# Patient Record
Sex: Male | Born: 1980 | Race: Black or African American | Hispanic: No | Marital: Single | Smoking: Former smoker
Health system: Southern US, Community
[De-identification: ages and names within clinical notes are randomized; demographics above are authoritative.]

## PROBLEM LIST (undated history)

## (undated) DIAGNOSIS — I509 Heart failure, unspecified: Secondary | ICD-10-CM

## (undated) DIAGNOSIS — I10 Essential (primary) hypertension: Secondary | ICD-10-CM

## (undated) DIAGNOSIS — E1159 Type 2 diabetes mellitus with other circulatory complications: Secondary | ICD-10-CM

## (undated) DIAGNOSIS — J9621 Acute and chronic respiratory failure with hypoxia: Secondary | ICD-10-CM

## (undated) DIAGNOSIS — F17200 Nicotine dependence, unspecified, uncomplicated: Secondary | ICD-10-CM

## (undated) DIAGNOSIS — E119 Type 2 diabetes mellitus without complications: Secondary | ICD-10-CM

## (undated) DIAGNOSIS — J9690 Respiratory failure, unspecified, unspecified whether with hypoxia or hypercapnia: Secondary | ICD-10-CM

---
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
## 2010-01-22 ENCOUNTER — Emergency Department (HOSPITAL_BASED_OUTPATIENT_CLINIC_OR_DEPARTMENT_OTHER): Admission: EM | Admit: 2010-01-22 | Discharge: 2010-01-22 | Payer: Self-pay | Admitting: Emergency Medicine

## 2010-12-10 ENCOUNTER — Encounter: Payer: Self-pay | Admitting: *Deleted

## 2010-12-10 ENCOUNTER — Emergency Department (HOSPITAL_BASED_OUTPATIENT_CLINIC_OR_DEPARTMENT_OTHER)
Admission: EM | Admit: 2010-12-10 | Discharge: 2010-12-10 | Disposition: A | Discharge status: Home / Self Care | Payer: Self-pay | Attending: Emergency Medicine | Admitting: Emergency Medicine

## 2010-12-10 DIAGNOSIS — M549 Dorsalgia, unspecified: Secondary | ICD-10-CM | POA: Insufficient documentation

## 2010-12-10 DIAGNOSIS — K0889 Other specified disorders of teeth and supporting structures: Secondary | ICD-10-CM

## 2010-12-10 DIAGNOSIS — K089 Disorder of teeth and supporting structures, unspecified: Secondary | ICD-10-CM | POA: Insufficient documentation

## 2010-12-10 MED ORDER — PENICILLIN V POTASSIUM 500 MG PO TABS: 500.0000 mg | ORAL_TABLET | Freq: Four times a day (QID) | ORAL | Status: AC

## 2010-12-10 MED ORDER — NAPROXEN 500 MG PO TABS: 500.0000 mg | ORAL_TABLET | Freq: Two times a day (BID) | ORAL | Status: AC

## 2010-12-10 MED ORDER — HYDROCODONE-ACETAMINOPHEN 5-325 MG PO TABS: 1.0000 | ORAL_TABLET | ORAL | Status: AC | PRN

## 2010-12-10 NOTE — ED Notes (Signed)
Pt c/o toothache months, and lower back pain x 1 day ago

## 2011-03-10 NOTE — ED Provider Notes (Signed)
History     CSN: [id number] Arrival date & time: 12/10/2010  9:07 PM   None     Chief Complaint  Patient presents with  . Dental Pain  . Back Pain    (Consider location/radiation/quality/duration/timing/severity/associated sxs/prior treatment) Patient is a 30 y.o. male presenting with tooth pain and back pain. The history is provided by the patient.  Dental PainThe primary symptoms include mouth pain. Primary symptoms do not include dental injury, oral bleeding, headaches, fever, shortness of breath, sore throat, angioedema or cough. Primary symptoms comment: TOOTHACHE The symptoms began more than 1 week ago (ONE MONTH AGO BUT WORSE YESTERDAY AND TODAY). The symptoms are worsening. The symptoms occur constantly.  Additional symptoms include: gum swelling, gum tenderness and jaw pain. Additional symptoms do not include: purulent gums, trismus, facial swelling, trouble swallowing, pain with swallowing, excessive salivation, drooling, ear pain and fatigue. Medical issues include: smoking.   Back Pain  Pertinent negatives include no chest pain, no fever, no numbness, no headaches, no abdominal pain, no dysuria and no weakness.  BACK PAIN BILAT LOWER BACK ONSET YESTERDAY, NO SPECIFIC INJURY EX WAS LIFTING HEAVY OBJECTS.   TOOTH INVOLVED IS RIGHT LOWER LAST MOLAR.   History reviewed. No pertinent past medical history.  History reviewed. No pertinent past surgical history.  History reviewed. No pertinent family history.  History  Substance Use Topics  . Smoking status: Current Everyday Smoker -- 0.5 packs/day  . Smokeless tobacco: Not on file  . Alcohol Use: No      Review of Systems  Constitutional: Negative for fever, chills and fatigue.  HENT: Positive for dental problem. Negative for ear pain, sore throat, facial swelling, drooling, trouble swallowing and neck pain.   Eyes: Negative for photophobia, redness and visual disturbance.  Respiratory: Negative for cough and shortness  of breath.   Cardiovascular: Negative for chest pain.  Gastrointestinal: Negative for nausea, vomiting and abdominal pain.  Genitourinary: Negative for dysuria and hematuria.  Musculoskeletal: Positive for back pain.  Skin: Negative for rash.  Neurological: Negative for dizziness, weakness, numbness and headaches.  Hematological: Negative for adenopathy.    Allergies  Review of patient's allergies indicates no known allergies.  Home Medications   Current Outpatient Rx  Name Route Sig Dispense Refill  . NAPROXEN 500 MG PO TABS Oral Take 1 tablet (500 mg total) by mouth 2 (two) times daily. 14 tablet 0    BP 124/83  Pulse 84  Temp 98.5 F (36.9 C)  Resp 16  Wt 200 lb (90.719 kg)  Physical Exam  Nursing note and vitals reviewed. Constitutional: He appears well-developed and well-nourished.  HENT:  Head: Normocephalic and atraumatic.  Mouth/Throat: Oropharynx is clear and moist.       EX FOR RIGHT LOWER LAST MOLAR WITH DECAY AND TENDERNESS NO PURULENT DISCHARGE.   Eyes: Conjunctivae and EOM are normal. Pupils are equal, round, and reactive to light.  Neck: Normal range of motion. Neck supple.  Cardiovascular: Normal rate, regular rhythm and normal heart sounds.   No murmur heard. Pulmonary/Chest: Effort normal and breath sounds normal.  Abdominal: Soft. Bowel sounds are normal. There is no tenderness.  Musculoskeletal: Normal range of motion. He exhibits tenderness. He exhibits no edema.       BILAT LOWER LUMBAR BACK PARASPINOUS TENDERNESS NO MIDLINE SPINOUS TENDERNESS.   Neurological: He is alert. No cranial nerve deficit. He exhibits normal muscle tone. Coordination normal.  Skin: No rash noted. No erythema.    ED Course  Procedures (including  critical care time)  Labs Reviewed - No data to display No results found.   1. Toothache   2. Back pain       MDM  NO LABS OR XRAYS.   BOTH TOOTHACHE AND LOW BACK PAIN. WILL TREAT BOTH WITH NAPROSYN AND HYDROCODONE  AND PENICILLIN FOR TOOTHACHE. WILL NEED DENTAL FOLLOW UP.         Shelda Jakes, MD 03/10/11 [phone]

## 2015-08-21 ENCOUNTER — Other Ambulatory Visit (HOSPITAL_COMMUNITY): Payer: Self-pay | Admitting: Dentistry

## 2015-08-21 DIAGNOSIS — G4730 Sleep apnea, unspecified: Secondary | ICD-10-CM

## 2015-08-21 DIAGNOSIS — E0500 Thyrotoxicosis with diffuse goiter without thyrotoxic crisis or storm: Secondary | ICD-10-CM

## 2015-08-30 ENCOUNTER — Ambulatory Visit (HOSPITAL_COMMUNITY): Payer: Disability Insurance | Attending: Cardiovascular Disease

## 2015-08-30 ENCOUNTER — Other Ambulatory Visit: Payer: Self-pay

## 2015-08-30 DIAGNOSIS — G4730 Sleep apnea, unspecified: Secondary | ICD-10-CM | POA: Diagnosis not present

## 2015-08-30 DIAGNOSIS — E0500 Thyrotoxicosis with diffuse goiter without thyrotoxic crisis or storm: Secondary | ICD-10-CM | POA: Diagnosis present

## 2015-08-30 DIAGNOSIS — Z6841 Body Mass Index (BMI) 40.0 and over, adult: Secondary | ICD-10-CM | POA: Diagnosis not present

## 2015-08-30 DIAGNOSIS — G4733 Obstructive sleep apnea (adult) (pediatric): Secondary | ICD-10-CM | POA: Insufficient documentation

## 2018-10-23 ENCOUNTER — Emergency Department (HOSPITAL_BASED_OUTPATIENT_CLINIC_OR_DEPARTMENT_OTHER): Payer: Self-pay

## 2018-10-23 ENCOUNTER — Encounter (HOSPITAL_BASED_OUTPATIENT_CLINIC_OR_DEPARTMENT_OTHER): Payer: Self-pay | Admitting: Emergency Medicine

## 2018-10-23 ENCOUNTER — Other Ambulatory Visit: Payer: Self-pay

## 2018-10-23 ENCOUNTER — Inpatient Hospital Stay (HOSPITAL_BASED_OUTPATIENT_CLINIC_OR_DEPARTMENT_OTHER)
Admission: EM | Admit: 2018-10-23 | Discharge: 2018-10-28 | DRG: 208 | Disposition: A | Discharge status: Home / Self Care | Payer: Self-pay | Attending: Internal Medicine | Admitting: Internal Medicine

## 2018-10-23 DIAGNOSIS — J9622 Acute and chronic respiratory failure with hypercapnia: Secondary | ICD-10-CM

## 2018-10-23 DIAGNOSIS — G92 Toxic encephalopathy: Secondary | ICD-10-CM | POA: Diagnosis present

## 2018-10-23 DIAGNOSIS — F17210 Nicotine dependence, cigarettes, uncomplicated: Secondary | ICD-10-CM | POA: Diagnosis present

## 2018-10-23 DIAGNOSIS — N183 Chronic kidney disease, stage 3 unspecified: Secondary | ICD-10-CM | POA: Diagnosis present

## 2018-10-23 DIAGNOSIS — J9601 Acute respiratory failure with hypoxia: Secondary | ICD-10-CM

## 2018-10-23 DIAGNOSIS — Z20828 Contact with and (suspected) exposure to other viral communicable diseases: Secondary | ICD-10-CM | POA: Diagnosis present

## 2018-10-23 DIAGNOSIS — J9621 Acute and chronic respiratory failure with hypoxia: Principal | ICD-10-CM | POA: Diagnosis present

## 2018-10-23 DIAGNOSIS — Z79899 Other long term (current) drug therapy: Secondary | ICD-10-CM

## 2018-10-23 DIAGNOSIS — E872 Acidosis: Secondary | ICD-10-CM | POA: Diagnosis present

## 2018-10-23 DIAGNOSIS — J9690 Respiratory failure, unspecified, unspecified whether with hypoxia or hypercapnia: Secondary | ICD-10-CM

## 2018-10-23 DIAGNOSIS — I152 Hypertension secondary to endocrine disorders: Secondary | ICD-10-CM

## 2018-10-23 DIAGNOSIS — E1159 Type 2 diabetes mellitus with other circulatory complications: Secondary | ICD-10-CM | POA: Diagnosis present

## 2018-10-23 DIAGNOSIS — Z9119 Patient's noncompliance with other medical treatment and regimen: Secondary | ICD-10-CM

## 2018-10-23 DIAGNOSIS — I452 Bifascicular block: Secondary | ICD-10-CM | POA: Diagnosis present

## 2018-10-23 DIAGNOSIS — I130 Hypertensive heart and chronic kidney disease with heart failure and stage 1 through stage 4 chronic kidney disease, or unspecified chronic kidney disease: Secondary | ICD-10-CM | POA: Diagnosis present

## 2018-10-23 DIAGNOSIS — J449 Chronic obstructive pulmonary disease, unspecified: Secondary | ICD-10-CM | POA: Diagnosis present

## 2018-10-23 DIAGNOSIS — Z6841 Body Mass Index (BMI) 40.0 and over, adult: Secondary | ICD-10-CM

## 2018-10-23 DIAGNOSIS — R7303 Prediabetes: Secondary | ICD-10-CM | POA: Diagnosis present

## 2018-10-23 DIAGNOSIS — I272 Pulmonary hypertension, unspecified: Secondary | ICD-10-CM | POA: Diagnosis present

## 2018-10-23 DIAGNOSIS — Z79890 Hormone replacement therapy (postmenopausal): Secondary | ICD-10-CM

## 2018-10-23 DIAGNOSIS — E039 Hypothyroidism, unspecified: Secondary | ICD-10-CM | POA: Diagnosis present

## 2018-10-23 DIAGNOSIS — M79672 Pain in left foot: Secondary | ICD-10-CM | POA: Diagnosis present

## 2018-10-23 DIAGNOSIS — N179 Acute kidney failure, unspecified: Secondary | ICD-10-CM | POA: Diagnosis present

## 2018-10-23 DIAGNOSIS — I5033 Acute on chronic diastolic (congestive) heart failure: Secondary | ICD-10-CM | POA: Diagnosis present

## 2018-10-23 DIAGNOSIS — E662 Morbid (severe) obesity with alveolar hypoventilation: Secondary | ICD-10-CM | POA: Diagnosis present

## 2018-10-23 HISTORY — DX: Type 2 diabetes mellitus without complications: E11.9

## 2018-10-23 HISTORY — DX: Morbid (severe) obesity due to excess calories: E66.01

## 2018-10-23 HISTORY — DX: Heart failure, unspecified: I50.9

## 2018-10-23 HISTORY — DX: Nicotine dependence, unspecified, uncomplicated: F17.200

## 2018-10-23 HISTORY — DX: Chronic kidney disease, stage 3 unspecified: N18.30

## 2018-10-23 HISTORY — DX: Respiratory failure, unspecified, unspecified whether with hypoxia or hypercapnia: J96.90

## 2018-10-23 HISTORY — DX: Essential (primary) hypertension: I10

## 2018-10-23 HISTORY — DX: Hypertension secondary to endocrine disorders: I15.2

## 2018-10-23 HISTORY — DX: Acute and chronic respiratory failure with hypoxia: J96.21

## 2018-10-23 HISTORY — DX: Type 2 diabetes mellitus with other circulatory complications: E11.59

## 2018-10-23 HISTORY — DX: Acute and chronic respiratory failure with hypercapnia: J96.22

## 2018-10-23 LAB — URINALYSIS, MICROSCOPIC (REFLEX)
Squamous Epithelial / HPF: NONE SEEN (ref 0–5)
WBC, UA: NONE SEEN WBC/hpf (ref 0–5)

## 2018-10-23 LAB — CBC WITH DIFFERENTIAL/PLATELET
Abs Immature Granulocytes: 0.07 10*3/uL (ref 0.00–0.07)
Basophils Absolute: 0.1 10*3/uL (ref 0.0–0.1)
Basophils Relative: 1 %
Eosinophils Absolute: 0.3 10*3/uL (ref 0.0–0.5)
Eosinophils Relative: 5 %
HCT: 42.4 % (ref 39.0–52.0)
Hemoglobin: 12.9 g/dL — ABNORMAL LOW (ref 13.0–17.0)
Immature Granulocytes: 1 %
Lymphocytes Relative: 20 %
Lymphs Abs: 1.4 10*3/uL (ref 0.7–4.0)
MCH: 28.7 pg (ref 26.0–34.0)
MCHC: 30.4 g/dL (ref 30.0–36.0)
MCV: 94.2 fL (ref 80.0–100.0)
Monocytes Absolute: 0.4 10*3/uL (ref 0.1–1.0)
Monocytes Relative: 7 %
Neutro Abs: 4.5 10*3/uL (ref 1.7–7.7)
Neutrophils Relative %: 66 %
Platelets: 163 10*3/uL (ref 150–400)
RBC: 4.5 MIL/uL (ref 4.22–5.81)
RDW: 15.7 % — ABNORMAL HIGH (ref 11.5–15.5)
WBC: 6.8 10*3/uL (ref 4.0–10.5)
nRBC: 0 % (ref 0.0–0.2)

## 2018-10-23 LAB — POCT I-STAT 7, (LYTES, BLD GAS, ICA,H+H)
Acid-Base Excess: 15 mmol/L — ABNORMAL HIGH (ref 0.0–2.0)
Acid-Base Excess: 13 mmol/L — ABNORMAL HIGH (ref 0.0–2.0)
Bicarbonate: 39.3 mmol/L — ABNORMAL HIGH (ref 20.0–28.0)
Bicarbonate: 35.9 mmol/L — ABNORMAL HIGH (ref 20.0–28.0)
Calcium, Ion: 1.14 mmol/L — ABNORMAL LOW (ref 1.15–1.40)
Calcium, Ion: 1.14 mmol/L — ABNORMAL LOW (ref 1.15–1.40)
HCT: 44 % (ref 39.0–52.0)
HCT: 42 % (ref 39.0–52.0)
Hemoglobin: 15 g/dL (ref 13.0–17.0)
Hemoglobin: 14.3 g/dL (ref 13.0–17.0)
O2 Saturation: 100 %
O2 Saturation: 93 %
Patient temperature: 98.6
Patient temperature: 36.6
Potassium: 3.2 mmol/L — ABNORMAL LOW (ref 3.5–5.1)
Potassium: 3 mmol/L — ABNORMAL LOW (ref 3.5–5.1)
Sodium: 137 mmol/L (ref 135–145)
Sodium: 138 mmol/L (ref 135–145)
TCO2: 41 mmol/L — ABNORMAL HIGH (ref 22–32)
TCO2: 37 mmol/L — ABNORMAL HIGH (ref 22–32)
pCO2 arterial: 42.8 mmHg (ref 32.0–48.0)
pCO2 arterial: 36.5 mmHg (ref 32.0–48.0)
pH, Arterial: 7.572 — ABNORMAL HIGH (ref 7.350–7.450)
pH, Arterial: 7.6 — ABNORMAL HIGH (ref 7.350–7.450)
pO2, Arterial: 362 mmHg — ABNORMAL HIGH (ref 83.0–108.0)
pO2, Arterial: 55 mmHg — ABNORMAL LOW (ref 83.0–108.0)

## 2018-10-23 LAB — URINALYSIS, ROUTINE W REFLEX MICROSCOPIC
Bilirubin Urine: NEGATIVE
Glucose, UA: NEGATIVE mg/dL
Ketones, ur: NEGATIVE mg/dL
Leukocytes,Ua: NEGATIVE
Nitrite: NEGATIVE
Protein, ur: >300 mg/dL — AB
Specific Gravity, Urine: 1.025 (ref 1.005–1.030)
pH: 6.5 (ref 5.0–8.0)

## 2018-10-23 LAB — COMPREHENSIVE METABOLIC PANEL WITH GFR
ALT: 17 U/L (ref 0–44)
AST: 41 U/L (ref 15–41)
Albumin: 3.6 g/dL (ref 3.5–5.0)
Alkaline Phosphatase: 43 U/L (ref 38–126)
Anion gap: 8 (ref 5–15)
BUN: 16 mg/dL (ref 6–20)
CO2: 35 mmol/L — ABNORMAL HIGH (ref 22–32)
Calcium: 8.8 mg/dL — ABNORMAL LOW (ref 8.9–10.3)
Chloride: 96 mmol/L — ABNORMAL LOW (ref 98–111)
Creatinine, Ser: 1.73 mg/dL — ABNORMAL HIGH (ref 0.61–1.24)
GFR calc Af Amer: 57 mL/min — ABNORMAL LOW
GFR calc non Af Amer: 49 mL/min — ABNORMAL LOW
Glucose, Bld: 151 mg/dL — ABNORMAL HIGH (ref 70–99)
Potassium: 3.6 mmol/L (ref 3.5–5.1)
Sodium: 139 mmol/L (ref 135–145)
Total Bilirubin: 1 mg/dL (ref 0.3–1.2)
Total Protein: 7.5 g/dL (ref 6.5–8.1)

## 2018-10-23 LAB — TROPONIN I: Troponin I: 0.04 ng/mL

## 2018-10-23 LAB — BRAIN NATRIURETIC PEPTIDE: B Natriuretic Peptide: 17.6 pg/mL (ref 0.0–100.0)

## 2018-10-23 LAB — MRSA PCR SCREENING: MRSA by PCR: NEGATIVE

## 2018-10-23 LAB — SARS CORONAVIRUS 2 AG (30 MIN TAT): SARS Coronavirus 2 Ag: NEGATIVE

## 2018-10-23 MED ORDER — ONDANSETRON HCL 4 MG/2ML IJ SOLN: 4.0000 mg | Freq: Four times a day (QID) | INTRAMUSCULAR | Status: DC | PRN

## 2018-10-23 MED ORDER — ACETAMINOPHEN 325 MG PO TABS
650.0000 mg | ORAL_TABLET | ORAL | Status: DC | PRN
  Administered 2018-10-25 – 2018-10-27 (×2): 650 mg via ORAL
  Filled 2018-10-23 (×2): qty 2

## 2018-10-23 MED ORDER — ROCURONIUM BROMIDE 50 MG/5ML IV SOLN
100.0000 mg | Freq: Once | INTRAVENOUS | Status: AC
  Administered 2018-10-23: 18:00:00 100 mg via INTRAVENOUS

## 2018-10-23 MED ORDER — PHENYLEPHRINE HCL-NACL 10-0.9 MG/250ML-% IV SOLN: 25.0000 ug/min | INTRAVENOUS | Status: DC

## 2018-10-23 MED ORDER — HEPARIN SODIUM (PORCINE) 5000 UNIT/ML IJ SOLN
5000.0000 [IU] | Freq: Three times a day (TID) | INTRAMUSCULAR | Status: DC
  Administered 2018-10-24 – 2018-10-28 (×14): 5000 [IU] via SUBCUTANEOUS
  Filled 2018-10-23 (×13): qty 1

## 2018-10-23 MED ORDER — PROPOFOL 1000 MG/100ML IV EMUL
INTRAVENOUS | Status: AC
  Administered 2018-10-23: 30 ug/kg/min via INTRAVENOUS
  Filled 2018-10-23: qty 100

## 2018-10-23 MED ORDER — PANTOPRAZOLE SODIUM 40 MG IV SOLR
40.0000 mg | Freq: Every day | INTRAVENOUS | Status: DC
  Administered 2018-10-24 (×2): 40 mg via INTRAVENOUS
  Filled 2018-10-23 (×2): qty 40

## 2018-10-23 MED ORDER — ROCURONIUM BROMIDE 50 MG/5ML IV SOLN
100.0000 mg | Freq: Once | INTRAVENOUS | Status: AC
  Administered 2018-10-23: 20:00:00 100 mg via INTRAVENOUS

## 2018-10-23 MED ORDER — ROCURONIUM BROMIDE 50 MG/5ML IV SOLN
INTRAVENOUS | Status: AC
  Filled 2018-10-23: qty 1

## 2018-10-23 MED ORDER — SODIUM CHLORIDE 0.9 % IV SOLN
250.0000 mL | INTRAVENOUS | Status: DC
  Administered 2018-10-24: 01:00:00 10 mL/h via INTRAVENOUS

## 2018-10-23 MED ORDER — FUROSEMIDE 10 MG/ML IJ SOLN
60.0000 mg | Freq: Two times a day (BID) | INTRAMUSCULAR | Status: DC
  Administered 2018-10-24 – 2018-10-25 (×3): 60 mg via INTRAVENOUS
  Filled 2018-10-23 (×3): qty 6

## 2018-10-23 MED ORDER — CHLORHEXIDINE GLUCONATE 0.12% ORAL RINSE (MEDLINE KIT)
15.0000 mL | Freq: Two times a day (BID) | OROMUCOSAL | Status: DC
  Administered 2018-10-23 – 2018-10-24 (×2): 15 mL via OROMUCOSAL

## 2018-10-23 MED ORDER — FUROSEMIDE 10 MG/ML IJ SOLN
60.0000 mg | Freq: Once | INTRAMUSCULAR | Status: AC
  Administered 2018-10-23: 19:00:00 60 mg via INTRAVENOUS
  Filled 2018-10-23: qty 6

## 2018-10-23 MED ORDER — ETOMIDATE 2 MG/ML IV SOLN
20.0000 mg | Freq: Once | INTRAVENOUS | Status: AC
  Administered 2018-10-23: 20 mg via INTRAVENOUS

## 2018-10-23 MED ORDER — LEVOTHYROXINE SODIUM 75 MCG PO TABS
75.0000 ug | ORAL_TABLET | Freq: Every day | ORAL | Status: DC
  Administered 2018-10-24 – 2018-10-28 (×5): 75 ug via ORAL
  Filled 2018-10-23 (×5): qty 1

## 2018-10-23 MED ORDER — PROPOFOL 1000 MG/100ML IV EMUL
5.0000 ug/kg/min | INTRAVENOUS | Status: DC
  Administered 2018-10-23: 30 ug/kg/min via INTRAVENOUS
  Administered 2018-10-23: 5 ug/kg/min via INTRAVENOUS
  Administered 2018-10-24: 10.032 ug/kg/min via INTRAVENOUS
  Administered 2018-10-24: 20 ug/kg/min via INTRAVENOUS
  Filled 2018-10-23 (×3): qty 100

## 2018-10-23 MED ORDER — INSULIN ASPART 100 UNIT/ML ~~LOC~~ SOLN: 0.0000 [IU] | SUBCUTANEOUS | Status: DC

## 2018-10-23 MED ORDER — POTASSIUM CHLORIDE 20 MEQ PO PACK
40.0000 meq | PACK | Freq: Once | ORAL | Status: AC
  Administered 2018-10-24: 01:00:00 40 meq via NASOGASTRIC
  Filled 2018-10-23 (×2): qty 2

## 2018-10-23 MED ORDER — IPRATROPIUM-ALBUTEROL 0.5-2.5 (3) MG/3ML IN SOLN
3.0000 mL | Freq: Four times a day (QID) | RESPIRATORY_TRACT | Status: DC
  Administered 2018-10-24 – 2018-10-25 (×8): 3 mL via RESPIRATORY_TRACT
  Filled 2018-10-23 (×8): qty 3

## 2018-10-23 MED ORDER — ORAL CARE MOUTH RINSE
15.0000 mL | OROMUCOSAL | Status: DC
  Administered 2018-10-24 (×4): 15 mL via OROMUCOSAL

## 2018-10-23 NOTE — ED Triage Notes (Signed)
Patient states that he has felt SOB and has a hx of CHF - he states that he has had an increased in bilateral peripheral edema - the patient is having SOB with rest

## 2018-10-23 NOTE — ED Notes (Signed)
FiO2 increased to 100% from 50%, O2 was 88%.  O2 sats is currently at 96% after FiO2 is increased.

## 2018-10-23 NOTE — Progress Notes (Signed)
RT called to assess patient for BiPAP. Hx of CHF. RA sat was 80%. Placed on 6L [place] and sat now 98%. MD okay with this. Will place on BiPAP if needed after covid swab per policy.

## 2018-10-23 NOTE — ED Notes (Signed)
Propofol decreased to 20 mcg d/t BP

## 2018-10-23 NOTE — ED Notes (Signed)
[facility] notified (Jaime) - patient ready for transport 

## 2018-10-23 NOTE — ED Notes (Signed)
Admin 20mg  of Etomidate IV followed by 100mg  of rocuronium IV

## 2018-10-23 NOTE — ED Notes (Signed)
Propofol  Decreased to mcg; rocuronium given as order.

## 2018-10-23 NOTE — ED Notes (Signed)
Date and time results received: 10/23/18 1745 (use smartphrase ".now" to insert current time)  Test: Troponin Critical Value: 0.04  Name of Provider Notified: Dr. Laverta Baltimore  Orders Received? Or Actions Taken?:

## 2018-10-23 NOTE — ED Notes (Signed)
ED Provider at bedside. 

## 2018-10-23 NOTE — ED Notes (Addendum)
Patient fell asleep and SAT dropped to 70%. Woke patient up and repositioned. SAT now 95%

## 2018-10-23 NOTE — H&P (Signed)
Name: Kyle Harrison MRN: [id number] DOB: [birth year]  Date of service : 10/23/18 Cc: sob, tx intubated from outside hospital    LOS: 0  [place] [facility] / Critical Care Note   History of Present Illness: 38 yo male with hx of COPD, HTN, DM, OSA on CPAP, morbid obesity, diastolic CHF, pulmonary HTN presenting to outside hospital with 2 weeks of worsening shortness of breath and lower extremity edema. Was hypoxic on arrival to OSH , placed on BIPAP and CO2 noted to be over 90, patient was altered and was intubated, given lasix and transferred to [facility]. During my evaluation the patient is intubated and sedated , moves all extremities and follows commands , making good urine.  Denies any chest pain. No fevers reported. [id number] test was negative.   Lines / Drains: PIV   Cultures: COVID19 NEG   Antibiotics: none   Tests / Events: transferred from outside ER after being intubated 6/21    Past Medical History:  Diagnosis Date  . CHF (congestive heart failure) ([place])   . Diabetes mellitus without complication ([place])   . Hypertension   . Smoker     History reviewed. No pertinent surgical history.  Prior to Admission medications   Medication Sig Start Date End Date Taking? Authorizing Provider  furosemide (LASIX) 20 MG tablet Take by mouth. 01/08/16  Yes [provider]  levothyroxine (SYNTHROID) 75 MCG tablet Take by mouth. 03/12/16  Yes [provider]  lisinopril (ZESTRIL) 10 MG tablet Take by mouth. 05/01/17  Yes [provider]  potassium chloride (K-DUR) 10 MEQ tablet Take by mouth. 05/01/17  Yes [provider]    Allergies No Known Allergies  Family History History reviewed. No pertinent family history.  Social History  reports that he has been smoking. He has been smoking about 0.50 packs per day. He has never used smokeless tobacco. He reports that he does not drink alcohol or use drugs.  Review Of Systems:   Vital  Signs: Temp:  [97.7 F (36.5 C)-98.5 F (36.9 C)] 98.5 F (36.9 C) (06/21 2010) Pulse Rate:  [71-96] 72 (06/21 2257) Resp:  [14-27] 14 (06/21 2257) BP: (89-169)/(51-129) 147/129 (06/21 2115) SpO2:  [82 %-100 %] 94 % (06/21 2257) FiO2 (%):  [50 %-100 %] 80 % (06/21 2257) Weight:  [167.8 kg] 167.8 kg (06/21 1657) No intake/output data recorded.  Physical Examination: General:intunated and sedated , follows commands on the vent , drooling Neuro: moves all extremities, PERRLA , EOMI CV: RRR , no mr/g PULM: CRACKLES b/l , no wheezing or ronchi GI: BS + , obese , nt nd Extremities: +2 pitting edema in the LE bl , DP+2 bl   Ventilator settings: Vent Mode: PRVC FiO2 (%):  [50 %-100 %] 80 % Set Rate:  [14 bmp-20 bmp] 14 bmp Vt Set:  [570 mL-580 mL] 570 mL PEEP:  [10 cmH20] 10 cmH20 Plateau Pressure:  [28 cmH20-31 cmH20] 30 cmH20  Labs    CBC Recent Labs  Lab 10/23/18 1659 10/23/18 1935 10/23/18 2254  HGB 12.9* 15.0 14.3  HCT 42.4 44.0 42.0  WBC 6.8  --   --   PLT 163  --   --      BMET Recent Labs  Lab 10/23/18 1659 10/23/18 1935 10/23/18 2254  NA 139 137 138  K 3.6 3.2* 3.0*  CL 96*  --   --   CO2 35*  --   --   GLUCOSE 151*  --   --  BUN 16  --   --   CREATININE 1.73*  --   --   CALCIUM 8.8*  --   --     No results for input(s): INR in the last 168 hours.  Recent Labs  Lab 10/23/18 1935 10/23/18 2254  PHART 7.572* 7.600*  PCO2ART 42.8 36.5  PO2ART 362.0* 55.0*  HCO3 39.3* 35.9*  TCO2 41* 37*  O2SAT 100.0 93.0     Radiology: CXR , ETT in place , pulmonary edema, cardiomegaly    Assessment and Plan: Principal Problem:   Acute on chronic respiratory failure with hypoxia and hypercapnia (HCC) Active Problems:   Respiratory failure (HCC)   Morbid (severe) obesity due to excess calories (HCC)   Acute on chronic diastolic CHF (congestive heart failure) (HCC)   Hypertension associated with diabetes (HCC)   CKD (chronic kidney disease) stage 3,  GFR 30-59 ml/min (HCC)  Neuro - acute toxic encephalopathy secondary to CO2 narcosis , improving   CVS - acute on chronic diastolic CHF, hx of pulm htn , OSA , non compliant with CPAP for the last 2 weeks. Diuresis initiated  Hold BP meds Echo in am  Lungs - acute on chronic resp failure with hypoxemia and hypercarbia secondary to CHF exacerbation as above. Cont diuresis. Check pct and bnp Hx of COPD , nebs q 6 hrs , no need for steroids at this point Full vent support , vent settings adjusted to 570/14/50/10 , repeat ABG in am  GI - nutrition c/s for TF , gi ppx  Nephro -  Unclear baseline function , suspect CKD , repeat labs pending , diuresis as above, replace KCL, check mag and phos  Hem - LE U/S to r/o DVT in am   Best practices / Disposition: -->Code Status: full  -->DVT Px: HSQ -->GI Px:PPI -->Diet: NUTRITION C/S for TF   Critical care time spent : 45 minutes excluding any procedures   10/23/2018, 11:47 PM

## 2018-10-23 NOTE — ED Provider Notes (Signed)
Emergency Department Provider Note   I have reviewed the triage vital signs and the nursing notes.   HISTORY  Chief Complaint Shortness of Breath   HPI Kyle Harrison is a 38 y.o. male with PMH of CHF, DM, HTN, and severe sleep apnea on 24 hour CPAC at home presents to the emergency department for evaluation of shortness of breath and lower extremity swelling worsening over the past 2 weeks.  He has associated chest pain that is also been present for 2 weeks.  Denies any fevers or cough.  He does not follow with a cardiologist.  He goes to the community clinic but they have been closed.  Patient states that he has been compliant with his medications.  Denies any sick contacts.    Past Medical History:  Diagnosis Date  . CHF (congestive heart failure) (HCC)   . Diabetes mellitus without complication (HCC)   . Hypertension     Patient Active Problem List   Diagnosis Date Noted  . Respiratory failure (HCC) 10/23/2018    History reviewed. No pertinent surgical history.  Allergies Patient has no known allergies.  History reviewed. No pertinent family history.  Social History Social History   Tobacco Use  . Smoking status: Current Every Day Smoker    Packs/day: 0.50  . Smokeless tobacco: Never Used  Substance Use Topics  . Alcohol use: No  . Drug use: No    Review of Systems  Constitutional: No fever/chills Eyes: No visual changes. ENT: No sore throat. Cardiovascular: Positive chest pain. Respiratory: Positive shortness of breath. Gastrointestinal: No abdominal pain.  No nausea, no vomiting.  No diarrhea.  No constipation. Genitourinary: Negative for dysuria. Musculoskeletal: Negative for back pain. Skin: Negative for rash. Neurological: Negative for headaches, focal weakness or numbness.  10-point ROS otherwise negative.  ____________________________________________   PHYSICAL EXAM:  VITAL SIGNS: ED Triage Vitals  Enc Vitals Group     BP 10/23/18  1700 129/89     Pulse Rate 10/23/18 1700 84     Resp 10/23/18 1700 16     Temp 10/23/18 1703 98.2 F (36.8 C)     Temp Source 10/23/18 1703 Oral     SpO2 10/23/18 1657 (!) 82 %     Weight 10/23/18 1657 (!) 370 lb (167.8 kg)     Height 10/23/18 1657 5\' 11"  (1.803 m)     Pain Score 10/23/18 1701 7   Constitutional: Alert and oriented. Appears drowsy at times but awakens easily. Eyes: Conjunctivae are normal. Head: Atraumatic. Nose: No congestion/rhinnorhea. Mouth/Throat: Mucous membranes are moist. Normal oropharynx.  Neck: No stridor. Cardiovascular: Normal rate, regular rhythm. Good peripheral circulation. Grossly normal heart sounds.   Respiratory: Normal respiratory effort.  No retractions. Lungs with crackles at the bases.  Gastrointestinal: Soft and nontender. No distention.  Musculoskeletal: No lower extremity tenderness with 2+ pitting edema. No gross deformities of extremities. Neurologic:  Normal speech and language.  Skin:  Skin is warm, dry and intact. No rash noted.  ____________________________________________   LABS (all labs ordered are listed, but only abnormal results are displayed)  Labs Reviewed  COMPREHENSIVE METABOLIC PANEL - Abnormal; Notable for the following components:      Result Value   Chloride 96 (*)    CO2 35 (*)    Glucose, Bld 151 (*)    Creatinine, Ser 1.73 (*)    Calcium 8.8 (*)    GFR calc non Af Amer 49 (*)    GFR calc Af Amer 57 (*)  All other components within normal limits  TROPONIN I - Abnormal; Notable for the following components:   Troponin I 0.04 (*)    All other components within normal limits  CBC WITH DIFFERENTIAL/PLATELET - Abnormal; Notable for the following components:   Hemoglobin 12.9 (*)    RDW 15.7 (*)    All other components within normal limits  URINALYSIS, ROUTINE W REFLEX MICROSCOPIC - Abnormal; Notable for the following components:   Hgb urine dipstick TRACE (*)    Protein, ur >300 (*)    All other  components within normal limits  URINALYSIS, MICROSCOPIC (REFLEX) - Abnormal; Notable for the following components:   Bacteria, UA FEW (*)    All other components within normal limits  POCT I-STAT 7, (LYTES, BLD GAS, ICA,H+H) - Abnormal; Notable for the following components:   pH, Arterial 7.572 (*)    pO2, Arterial 362.0 (*)    Bicarbonate 39.3 (*)    TCO2 41 (*)    Acid-Base Excess 15.0 (*)    Potassium 3.2 (*)    Calcium, Ion 1.14 (*)    All other components within normal limits  SARS CORONAVIRUS 2 (HOSP ORDER, PERFORMED IN [place] [facility] VIA ABBOTT ID)  BRAIN NATRIURETIC PEPTIDE   ____________________________________________  EKG   EKG Interpretation  Date/Time:  Sunday October 23 2018 16:57:23 EDT Ventricular Rate:  89 PR Interval:    QRS Duration: 143 QT Interval:  449 QTC Calculation: 547 R Axis:   -45 Text Interpretation:  Sinus rhythm RBBB and LAFB Baseline wander in lead(s) II III aVF No STEMI. No prior for comparison.  Confirmed by Alona BeneLong, Travares Nelles [phone] on 10/23/2018 5:00:15 PM       ____________________________________________  RADIOLOGY  Dg Chest Portable 1 View  Result Date: 10/23/2018 CLINICAL DATA:  Intubated EXAM: PORTABLE CHEST 1 VIEW COMPARISON:  10/23/2018, 06/13/2018 FINDINGS: Interval intubation, tip of the endotracheal tube is about 3.7 cm superior to the carina. Esophageal tube tip below the diaphragm but non included. Cardiomegaly with vascular congestion and bilateral interstitial and ground-glass opacity, suspect for mild edema. IMPRESSION: 1. Endotracheal tube tip about 3.7 cm superior to carina. Esophageal tube tip below the diaphragm but non included 2. Cardiomegaly with vascular congestion and mild diffuse bilateral interstitial and ground-glass opacities suspect for edema Electronically Signed   By: Jasmine PangKim  Fujinaga M.D.   On: 10/23/2018 19:17   Dg Chest Portable 1 View  Result Date: 10/23/2018 CLINICAL DATA:  Acute shortness of breath EXAM:  PORTABLE CHEST 1 VIEW COMPARISON:  06/13/2018 and prior radiographs FINDINGS: Cardiomegaly and pulmonary vascular congestion noted. There is no evidence of focal airspace disease, pulmonary edema, suspicious pulmonary nodule/mass, pleural effusion, or pneumothorax. No acute bony abnormalities are identified. IMPRESSION: Cardiomegaly with pulmonary vascular congestion. Electronically Signed   By: Harmon PierJeffrey  Hu M.D.   On: 10/23/2018 18:11    ____________________________________________   PROCEDURES  Procedure(s) performed:   Date/Time: 10/23/2018 7:40 PM Performed by: Maia PlanLong, Dartanion Teo G, MD Pre-anesthesia Checklist: Emergency Drugs available, Patient identified, Suction available and Patient being monitored Oxygen Delivery Method: Non-rebreather mask Preoxygenation: Pre-oxygenation with 100% oxygen Induction Type: Rapid sequence Ventilation: Mask ventilation without difficulty Laryngoscope Size: Glidescope and 4 Grade View: Grade IV Tube size: 8.0 mm Number of attempts: 1 Airway Equipment and Method: Video-laryngoscopy Placement Confirmation: ETT inserted through vocal cords under direct vision,  CO2 detector and Breath sounds checked- equal and bilateral Secured at: 24 cm Tube secured with: ETT holder Dental Injury: Teeth and Oropharynx as per pre-operative assessment  Difficulty Due To: Difficulty was anticipated, Difficult Airway- due to large tongue, Difficult Airway-  due to edematous airway, Difficult Airway- due to limited oral opening, Difficult Airway- due to reduced neck mobility and Difficult Airway-due to vocal cord/laryngeal edema Future Recommendations: Recommend- induction with short-acting agent, and alternative techniques readily available    .Critical Care Performed by: Margette Fast, MD Authorized by: Margette Fast, MD   Critical care provider statement:    Critical care time (minutes):  75   Critical care time was exclusive of:  Separately billable procedures and  treating other patients and teaching time   Critical care was necessary to treat or prevent imminent or life-threatening deterioration of the following conditions:  Respiratory failure   Critical care was time spent personally by me on the following activities:  Blood draw for specimens, development of treatment plan with patient or surrogate, discussions with consultants, evaluation of patient's response to treatment, examination of patient, ventilator management, re-evaluation of patient's condition, pulse oximetry, ordering and review of radiographic studies, ordering and review of laboratory studies, ordering and performing treatments and interventions and review of old charts   I assumed direction of critical care for this patient from another provider in my specialty: no       ____________________________________________   INITIAL IMPRESSION / [place] / ED COURSE  Pertinent labs & imaging results that were available during my care of the patient were reviewed by me and considered in my medical decision making (see chart for details).   Patient presents to the emergency department for evaluation of shortness of breath and hypoxemia.  Somewhat drowsy on arrival but awakens easily and able to give some history.  Has notable bilateral lower extremity edema.  Rapid COVID test is negative.  Afebrile.  Patient with multiple medical comorbidities which would potentially cause his symptoms including obesity, severe sleep apnea, congestive heart failure.   06:15 PM  Patient's mental status has declined despite being on BiPAP now for almost 30 minutes.  Patient's ABG shows respiratory acidosis with CO2 greater than 97.  No prior values for comparison.  Patient having intermittent apnea and desaturations.  Unfortunately, given his worsening clinical status I do feel that intubation is the next best course.   Discussed patient's case with ICU to request admission. Patient and family (if  present) updated with plan. Placed temporary admit orders at their request.   I reviewed all nursing notes, vitals, pertinent old records, EKGs, labs, imaging (as available).  07:40 PM  Repeat ABG much improved.  Patient remains hemodynamically stable.  Intubation described as above.  Patient was a difficult intubation as described but no hypoxia or significant complications.   [facility] arrived for transport. Patient stable for transport at this time. Provided additional paralytic for transport. Patient well sedated but with tenuous airway felt paralytic was advised.   ____________________________________________  FINAL CLINICAL IMPRESSION(S) / ED DIAGNOSES  Final diagnoses:  Acute respiratory failure with hypoxia ([place])    MEDICATIONS GIVEN DURING THIS VISIT:  Medications  propofol (DIPRIVAN) 1000 MG/100ML infusion (20 mcg/kg/min  167.8 kg Intravenous Rate/Dose Change 10/23/18 1851)  furosemide (LASIX) injection 60 mg (60 mg Intravenous Given 10/23/18 1838)  rocuronium [facility]) injection 100 mg (100 mg Intravenous Given 10/23/18 1829)  etomidate (AMIDATE) injection 20 mg (20 mg Intravenous Given 10/23/18 1829)    Note:  This document was prepared using Dragon voice recognition software and may include unintentional dictation errors.  Nanda Quinton, MD Emergency Medicine  Maia PlanLong, Davinity Fanara G, MD 10/23/18 2042

## 2018-10-23 NOTE — ED Notes (Signed)
Propofol increased to 30 mcg.  Patient is breathing against the vent.  O2 is decreased from 100% to 50%. Copious amount of clear secretions draining from his nose and mouth.

## 2018-10-24 ENCOUNTER — Inpatient Hospital Stay (HOSPITAL_COMMUNITY): Payer: Self-pay

## 2018-10-24 DIAGNOSIS — I5031 Acute diastolic (congestive) heart failure: Secondary | ICD-10-CM

## 2018-10-24 DIAGNOSIS — R0602 Shortness of breath: Secondary | ICD-10-CM

## 2018-10-24 DIAGNOSIS — M7989 Other specified soft tissue disorders: Secondary | ICD-10-CM

## 2018-10-24 LAB — POCT I-STAT 7, (LYTES, BLD GAS, ICA,H+H)
Acid-Base Excess: 9 mmol/L — ABNORMAL HIGH (ref 0.0–2.0)
Bicarbonate: 34.3 mmol/L — ABNORMAL HIGH (ref 20.0–28.0)
Calcium, Ion: 1.15 mmol/L (ref 1.15–1.40)
HCT: 43 % (ref 39.0–52.0)
Hemoglobin: 14.6 g/dL (ref 13.0–17.0)
O2 Saturation: 97 %
Patient temperature: 96.7
Potassium: 3.2 mmol/L — ABNORMAL LOW (ref 3.5–5.1)
Sodium: 137 mmol/L (ref 135–145)
TCO2: 36 mmol/L — ABNORMAL HIGH (ref 22–32)
pCO2 arterial: 45.1 mmHg (ref 32.0–48.0)
pH, Arterial: 7.484 — ABNORMAL HIGH (ref 7.350–7.450)
pO2, Arterial: 83 mmHg (ref 83.0–108.0)

## 2018-10-24 LAB — CBC
HCT: 42 % (ref 39.0–52.0)
HCT: 42.3 % (ref 39.0–52.0)
Hemoglobin: 13.3 g/dL (ref 13.0–17.0)
Hemoglobin: 13.3 g/dL (ref 13.0–17.0)
MCH: 28.9 pg (ref 26.0–34.0)
MCH: 29 pg (ref 26.0–34.0)
MCHC: 31.4 g/dL (ref 30.0–36.0)
MCHC: 31.7 g/dL (ref 30.0–36.0)
MCV: 91.5 fL (ref 80.0–100.0)
MCV: 92 fL (ref 80.0–100.0)
Platelets: 147 10*3/uL — ABNORMAL LOW (ref 150–400)
Platelets: 167 10*3/uL (ref 150–400)
RBC: 4.59 MIL/uL (ref 4.22–5.81)
RBC: 4.6 MIL/uL (ref 4.22–5.81)
RDW: 15.5 % (ref 11.5–15.5)
RDW: 15.7 % — ABNORMAL HIGH (ref 11.5–15.5)
WBC: 5.8 10*3/uL (ref 4.0–10.5)
WBC: 8.1 10*3/uL (ref 4.0–10.5)
nRBC: 0 % (ref 0.0–0.2)
nRBC: 0 % (ref 0.0–0.2)

## 2018-10-24 LAB — BASIC METABOLIC PANEL
Anion gap: 11 (ref 5–15)
Anion gap: 8 (ref 5–15)
BUN: 13 mg/dL (ref 6–20)
BUN: 14 mg/dL (ref 6–20)
CO2: 32 mmol/L (ref 22–32)
CO2: 34 mmol/L — ABNORMAL HIGH (ref 22–32)
Calcium: 9 mg/dL (ref 8.9–10.3)
Calcium: 8.9 mg/dL (ref 8.9–10.3)
Chloride: 96 mmol/L — ABNORMAL LOW (ref 98–111)
Chloride: 96 mmol/L — ABNORMAL LOW (ref 98–111)
Creatinine, Ser: 1.94 mg/dL — ABNORMAL HIGH (ref 0.61–1.24)
Creatinine, Ser: 2.21 mg/dL — ABNORMAL HIGH (ref 0.61–1.24)
GFR calc Af Amer: 49 mL/min — ABNORMAL LOW (ref >60)
GFR calc Af Amer: 42 mL/min — ABNORMAL LOW (ref >60)
GFR calc non Af Amer: 43 mL/min — ABNORMAL LOW (ref >60)
GFR calc non Af Amer: 36 mL/min — ABNORMAL LOW (ref >60)
Glucose, Bld: 92 mg/dL (ref 70–99)
Glucose, Bld: 94 mg/dL (ref 70–99)
Potassium: 3.9 mmol/L (ref 3.5–5.1)
Potassium: 3.6 mmol/L (ref 3.5–5.1)
Sodium: 139 mmol/L (ref 135–145)
Sodium: 138 mmol/L (ref 135–145)

## 2018-10-24 LAB — CBG MONITORING, ED: Glucose-Capillary: 77 mg/dL (ref 70–99)

## 2018-10-24 LAB — RENAL FUNCTION PANEL
Albumin: 3.4 g/dL — ABNORMAL LOW (ref 3.5–5.0)
Anion gap: 10 (ref 5–15)
BUN: 14 mg/dL (ref 6–20)
CO2: 34 mmol/L — ABNORMAL HIGH (ref 22–32)
Calcium: 8.9 mg/dL (ref 8.9–10.3)
Chloride: 95 mmol/L — ABNORMAL LOW (ref 98–111)
Creatinine, Ser: 1.9 mg/dL — ABNORMAL HIGH (ref 0.61–1.24)
GFR calc Af Amer: 51 mL/min — ABNORMAL LOW (ref >60)
GFR calc non Af Amer: 44 mL/min — ABNORMAL LOW (ref >60)
Glucose, Bld: 92 mg/dL (ref 70–99)
Phosphorus: 2.6 mg/dL (ref 2.5–4.6)
Potassium: 4.1 mmol/L (ref 3.5–5.1)
Sodium: 139 mmol/L (ref 135–145)

## 2018-10-24 LAB — LACTIC ACID, PLASMA
Lactic Acid, Venous: 2.1 mmol/L (ref 0.5–1.9)
Lactic Acid, Venous: 2.3 mmol/L (ref 0.5–1.9)

## 2018-10-24 LAB — GLUCOSE, CAPILLARY
Glucose-Capillary: 100 mg/dL — ABNORMAL HIGH (ref 70–99)
Glucose-Capillary: 42 mg/dL — CL (ref 70–99)
Glucose-Capillary: 51 mg/dL — ABNORMAL LOW (ref 70–99)
Glucose-Capillary: 53 mg/dL — ABNORMAL LOW (ref 70–99)
Glucose-Capillary: 68 mg/dL — ABNORMAL LOW (ref 70–99)
Glucose-Capillary: 86 mg/dL (ref 70–99)

## 2018-10-24 LAB — TROPONIN I: Troponin I: 0.03 ng/mL (ref <0.03)

## 2018-10-24 LAB — PROCALCITONIN
Procalcitonin: <0.1 ng/mL
Procalcitonin: <0.1 ng/mL

## 2018-10-24 LAB — ECHOCARDIOGRAM COMPLETE
Height: 71 in
Weight: 5763.71 oz

## 2018-10-24 LAB — MAGNESIUM: Magnesium: 2.1 mg/dL (ref 1.7–2.4)

## 2018-10-24 LAB — CK: Total CK: 1738 U/L — ABNORMAL HIGH (ref 49–397)

## 2018-10-24 LAB — HIV ANTIBODY (ROUTINE TESTING W REFLEX): HIV Screen 4th Generation wRfx: NONREACTIVE

## 2018-10-24 LAB — PHOSPHORUS: Phosphorus: 2.7 mg/dL (ref 2.5–4.6)

## 2018-10-24 MED ORDER — DEXTROSE 50 % IV SOLN
INTRAVENOUS | Status: AC
  Filled 2018-10-24: qty 50

## 2018-10-24 MED ORDER — CHLORHEXIDINE GLUCONATE CLOTH 2 % EX PADS
6.0000 | MEDICATED_PAD | Freq: Every day | CUTANEOUS | Status: DC
  Administered 2018-10-24: 6 via TOPICAL

## 2018-10-24 MED ORDER — DEXTROSE 50 % IV SOLN
INTRAVENOUS | Status: AC
  Administered 2018-10-24: 50 mL
  Filled 2018-10-24: qty 50

## 2018-10-24 MED ORDER — ORAL CARE MOUTH RINSE
15.0000 mL | Freq: Two times a day (BID) | OROMUCOSAL | Status: DC
  Administered 2018-10-24 (×2): 15 mL via OROMUCOSAL

## 2018-10-24 MED ORDER — ENSURE MAX PROTEIN PO LIQD
11.0000 [oz_av] | Freq: Two times a day (BID) | ORAL | Status: DC
  Administered 2018-10-24 – 2018-10-27 (×6): 11 [oz_av] via ORAL
  Filled 2018-10-24 (×10): qty 330

## 2018-10-24 MED ORDER — CHLORHEXIDINE GLUCONATE 0.12 % MT SOLN
15.0000 mL | Freq: Two times a day (BID) | OROMUCOSAL | Status: DC
  Administered 2018-10-24: 15 mL via OROMUCOSAL
  Filled 2018-10-24: qty 15

## 2018-10-24 MED ORDER — DEXTROSE 50 % IV SOLN
25.0000 g | Freq: Once | INTRAVENOUS | Status: AC
  Administered 2018-10-24: 25 g via INTRAVENOUS

## 2018-10-24 MED ORDER — POTASSIUM CHLORIDE 20 MEQ/15ML (10%) PO SOLN
40.0000 meq | Freq: Once | ORAL | Status: AC
  Administered 2018-10-24: 40 meq
  Filled 2018-10-24: qty 30

## 2018-10-24 NOTE — Progress Notes (Addendum)
  Echocardiogram 2D Echocardiogram has been performed.  Patient on Bipap during length of exam.  Samer Dutton L Androw 10/24/2018, 11:33 AM

## 2018-10-24 NOTE — Progress Notes (Addendum)
eLink Physician-Brief Progress Note Patient Name: Kyle Harrison DOB: 1980/12/31 MRN: [id number]   Date of Service  10/24/2018  HPI/Events of Note  38/m with CHF, DM, OSA, brought to the ED due to shortness of breath and peripheral edema.  Pt was placed on BIPAP but had deteriorating mental status and was subsequently intubated.  ABG reviewed.  eICU Interventions  Pt now intubated. Continue diuretics and neb treatments. Check 2d echo. Heparin for DVT prophylaxis.  Pt is on pantoprazole.     Intervention Category Evaluation Type: New Patient Evaluation  Elsie Lincoln 10/24/2018, 12:57 AM

## 2018-10-24 NOTE — Progress Notes (Signed)
eLink Physician-Brief Progress Note Patient Name: Kyle Harrison DOB: 27-May-1980 MRN: [id number]   Date of Service  10/24/2018  HPI/Events of Note  Video assessment revealed that patient is awake and alert, sitting in the recliner, having his meal.   BP 124/61, HR 74, RR 22, 96% O2 sats.  eICU Interventions  Continue on BIPAP nightly. Will check BMP tonight while pt is being diuresed and replete electrolytes as needed.     Intervention Category Minor Interventions: Other:  Elsie Lincoln 10/24/2018, 7:55 PM

## 2018-10-24 NOTE — Progress Notes (Signed)
Initial Nutrition Assessment  RD working remotely.  DOCUMENTATION CODES:   Morbid obesity  INTERVENTION:   - RD to monitor for diet advancement and supplement as appropriate  NUTRITION DIAGNOSIS:   Inadequate oral intake related to inability to eat as evidenced by NPO status.  GOAL:   Patient will meet greater than or equal to 90% of their needs  MONITOR:   Labs, Weight trends, I & O's, Diet advancement  REASON FOR ASSESSMENT:   Ventilator, Consult Other (assessment and recommendations for tube feeding)  ASSESSMENT:   38 year old male who presented to the ED on 6/21 with SOB. PMH of CHF, DM, HTN, severe sleep apnea. Pt intubated in the ED.  RD consulted for assessment and recommendations for TF. Pt extubated this morning and no current enteral access.  Pt is NPO at present. RD will monitor for diet advancement and supplement as appropriate.  Medications reviewed and include: Lasix, SSI, Protonix  Labs reviewed: potassium 3.2 CBG's: 51-100  UOP: 2875 ml x 24 hours I/O's: -3.9 L since admit  NUTRITION - FOCUSED PHYSICAL EXAM:  Unable to complete at this time. RD working remotely.  Diet Order:   Diet Order            Diet NPO time specified Except for: Ice Chips  Diet effective now              EDUCATION NEEDS:   Not appropriate for education at this time  Skin:  Skin Assessment: Reviewed RN Assessment  Last BM:  no documented BM  Height:   Ht Readings from Last 1 Encounters:  10/23/18 5\' 11"  (1.803 m)    Weight:   Wt Readings from Last 1 Encounters:  10/24/18 (!) 163.4 kg    Ideal Body Weight:  78.2 kg  BMI:  Body mass index is 50.24 kg/m.  Estimated Nutritional Needs:   Kcal:  2300-2500  Protein:  115-130 grams  Fluid:  per MD    Gaynell Face, MS, RD, LDN Inpatient Clinical Dietitian Pager: [phone] Weekend/After Hours: [phone]

## 2018-10-24 NOTE — Progress Notes (Signed)
Lower extremity venous has been completed.   Preliminary results in CV Proc.   Abram Sander 10/24/2018 10:59 AM

## 2018-10-24 NOTE — Progress Notes (Signed)
Sputum culture collected and sent to the lab. 

## 2018-10-24 NOTE — Progress Notes (Signed)
Inpatient Diabetes Program Recommendations  AACE/ADA: New Consensus Statement on Inpatient Glycemic Control (2015)  Target Ranges:  Prepandial:   less than 140 mg/dL      Peak postprandial:   less than 180 mg/dL (1-2 hours)      Critically ill patients:  140 - 180 mg/dL   Lab Results  Component Value Date   GLUCAP 100 (H) 10/24/2018    Review of Glycemic ControlResults for [name], [name] (MRN [id number]) as of 10/24/2018 08:02  Ref. Range 10/24/2018 00:30 10/24/2018 03:54 10/24/2018 04:31  Glucose-Capillary Latest Ref Range: 70 - 99 mg/dL 68 (L) 51 (L) 100 (H)    Diabetes history: DM Outpatient Diabetes medications: None Current orders for Inpatient glycemic control:  Novolog moderate q 4 hours  Inpatient Diabetes Program Recommendations:    Consider reducing Novolog correction to sensitive q 4 hours.   Thanks,  Adah Perl, RN, BC-ADM Inpatient Diabetes Coordinator Pager [phone] (8a-5p)

## 2018-10-24 NOTE — Procedures (Signed)
Intubation Procedure Note Kyle Harrison [id number] 07/17/1980  Procedure: Intubation Indications: Airway protection and maintenance  Procedure Details Consent: Risks of procedure as well as the alternatives and risks of each were explained to the (patient/caregiver).  Consent for procedure obtained. Time Out: Verified patient identification, verified procedure, site/side was marked, verified correct patient position, special equipment/implants available, medications/allergies/relevent history reviewed, required imaging and test results available.  Performed  Maximum sterile technique was used including gloves.  MAC     Evaluation Hemodynamic Status: BP stable throughout; O2 sats: stable throughout Patient's Current Condition: stable Complications: No apparent complications Patient did tolerate procedure well. Chest X-ray ordered to verify placement.  CXR: pending.   Chriss Driver [facility] 10/24/2018

## 2018-10-24 NOTE — Progress Notes (Signed)
ABG drawn, critical results given to Dr. Claudie Leach.  Dropped the RR from 20 to 14.  Recheck ABG in the AM per Dr. Claudie Leach

## 2018-10-24 NOTE — Progress Notes (Signed)
Pt placed on Bipap for nighttime rest.  Patient tolerating well at this time.

## 2018-10-24 NOTE — Progress Notes (Signed)
Name: Kyle Harrison MRN: [id number] DOB: 03-Aug-1980  Date of service : 10/23/18 Cc: sob, tx intubated from outside hospital    LOS: 1  [place] [facility] Critical Care Note   History of Present Illness: 38 yo male with hx of  HTN, DM, OSA on CPAP, morbid obesity, diastolic CHF, pulmonary HTN presenting to outside hospital with 2 weeks of worsening shortness of breath and lower extremity edema. Was hypoxic on arrival to OSH , placed on BIPAP and CO2 noted to be over 90, patient was altered and was intubated, given lasix [facility] [place]. [phone] test was negative.   Lines / Drains: PIV ETT 6/21 >> 6/22  Cultures: COVID19 NEG   Antibiotics: none   Tests / Events: transferred from outside ER after being intubated 6/21 dupex LE 6/22 neg Echo 6/22 >>   Past Medical History:  Diagnosis Date  . CHF (congestive heart failure) (HCC)   . Diabetes mellitus without complication (HCC)   . Hypertension   . Smoker     SUBJ  Critically ill, intubated Sedated on low-dose propofol, off Neo-Synephrine    Vital Signs: Temp:  [97.3 F (36.3 C)-98.5 F (36.9 C)] 97.7 F (36.5 C) (06/22 0845) Pulse Rate:  [54-96] 74 (06/22 0845) Resp:  [0-27] 0 (06/22 0845) BP: (89-169)/(51-129) 124/94 (06/22 0845) SpO2:  [82 %-100 %] 98 % (06/22 0845) FiO2 (%):  [50 %-100 %] 60 % (06/22 0808) Weight:  [163.4 kg-167.8 kg] 163.4 kg (06/22 0413) I/O last 3 completed shifts: In: 340.9 [I.V.:340.9] Out: 2875 [Urine:2875]  Physical Examination: General: Morbidly obese young man, intubated  Neuro: Awake on propofol drip, moves all extremities, follows commands CV: RRR , no mr/g PULM: Breath sounds bilateral, no wheezing or ronchi GI: BS + , obese , nt nd Extremities: +2 pitting edema , pulses intact  Chest x-ray personally reviewed which suggest left lower lobe infiltrate   Ventilator settings: Vent Mode: PRVC FiO2 (%):  [50 %-100 %] 60 % Set Rate:  [14 bmp-20 bmp] 14  bmp Vt Set:  [570 mL-580 mL] 570 mL PEEP:  [10 cmH20] 10 cmH20 Plateau Pressure:  [25 cmH20-31 cmH20] 26 cmH20  Assessment and Plan: Principal Problem:   Acute on chronic respiratory failure with hypoxia and hypercapnia (HCC) Active Problems:   Respiratory failure (HCC)   Morbid (severe) obesity due to excess calories (HCC)   Acute on chronic diastolic CHF (congestive heart failure) (HCC)   Hypertension associated with diabetes (HCC)   CKD (chronic kidney disease) stage 3, GFR 30-59 ml/min (HCC)     Acute on chronic resp failure with hypoxemia and hypercarbia secondary to CHF exacerbation Vs PAP noncompliance. Doubt diagnosis of COPD, no bronchospasm Plan -spontaneous breathing trials, tolerated pressure support and was extubated to BiPAP We will institute BiPAP nightly Will need sleep study as outpatient and understand better if there was a reason for noncompliance   acute toxic encephalopathy secondary to CO2 narcosis , improving   Acute on chronic diastolic CHF, hx of pulm htn , OSA , non compliant with CPAP for the last 2 weeks.  BNP normal -Continue Lasix 60 every 12 for negative balance -Obtain echo for completion   AKI on  CKD-unclear baseline Follow bmet while on Lasix    The patient is critically ill with multiple organ systems failure and requires high complexity decision making for assessment and support, frequent evaluation and titration of therapies, application of advanced monitoring technologies and extensive interpretation of multiple databases. Critical  Care Time devoted to patient care services described in this note independent of APP/resident  time is 35 minutes.   Kara Mead MD. Sh[facility] Flood. [place] [facility] Pager [phone] If no response call [phone]    10/24/2018, 9:08 AM

## 2018-10-24 NOTE — Procedures (Signed)
Extubation Procedure Note  Patient Details:   Name: Nivek Powley DOB: 16-Dec-1980 MRN: [id number]   Airway Documentation:    Vent end date: 10/24/18 Vent end time: 0940   Evaluation  O2 sats: stable throughout Complications: No apparent complications Patient did tolerate procedure well. Bilateral Breath Sounds: Diminished   Pt extubated per MD order. Pt had +cuff leak. Strong cough & able to voice.  Pt placed on Bipap per order, tolerating well   Ciro Backer 10/24/2018, 9:53 AM

## 2018-10-25 ENCOUNTER — Encounter (HOSPITAL_COMMUNITY): Payer: Self-pay | Admitting: Cardiology

## 2018-10-25 LAB — BASIC METABOLIC PANEL
Anion gap: 7 (ref 5–15)
BUN: 17 mg/dL (ref 6–20)
CO2: 36 mmol/L — ABNORMAL HIGH (ref 22–32)
Calcium: 9.1 mg/dL (ref 8.9–10.3)
Chloride: 96 mmol/L — ABNORMAL LOW (ref 98–111)
Creatinine, Ser: 2.4 mg/dL — ABNORMAL HIGH (ref 0.61–1.24)
GFR calc Af Amer: 38 mL/min — ABNORMAL LOW (ref >60)
GFR calc non Af Amer: 33 mL/min — ABNORMAL LOW (ref >60)
Glucose, Bld: 112 mg/dL — ABNORMAL HIGH (ref 70–99)
Potassium: 4.2 mmol/L (ref 3.5–5.1)
Sodium: 139 mmol/L (ref 135–145)

## 2018-10-25 LAB — GLUCOSE, CAPILLARY
Glucose-Capillary: 113 mg/dL — ABNORMAL HIGH (ref 70–99)
Glucose-Capillary: 95 mg/dL (ref 70–99)
Glucose-Capillary: 78 mg/dL (ref 70–99)
Glucose-Capillary: 58 mg/dL — ABNORMAL LOW (ref 70–99)
Glucose-Capillary: 91 mg/dL (ref 70–99)
Glucose-Capillary: 63 mg/dL — ABNORMAL LOW (ref 70–99)
Glucose-Capillary: 101 mg/dL — ABNORMAL HIGH (ref 70–99)

## 2018-10-25 LAB — HEMOGLOBIN A1C
Hgb A1c MFr Bld: 6.1 % — ABNORMAL HIGH (ref 4.8–5.6)
Hgb A1c MFr Bld: 6.2 % — ABNORMAL HIGH (ref 4.8–5.6)
Mean Plasma Glucose: 128 mg/dL
Mean Plasma Glucose: 131.24 mg/dL

## 2018-10-25 LAB — PROCALCITONIN: Procalcitonin: <0.1 ng/mL

## 2018-10-25 MED ORDER — INSULIN ASPART 100 UNIT/ML ~~LOC~~ SOLN: 0.0000 [IU] | Freq: Every day | SUBCUTANEOUS | Status: DC

## 2018-10-25 MED ORDER — IBUPROFEN 600 MG PO TABS: 600.0000 mg | ORAL_TABLET | Freq: Four times a day (QID) | ORAL | Status: DC | PRN

## 2018-10-25 MED ORDER — COLCHICINE 0.6 MG PO TABS
0.6000 mg | ORAL_TABLET | Freq: Once | ORAL | Status: AC
  Administered 2018-10-25: 0.6 mg via ORAL
  Filled 2018-10-25: qty 1

## 2018-10-25 MED ORDER — AMLODIPINE BESYLATE 5 MG PO TABS
5.0000 mg | ORAL_TABLET | Freq: Every day | ORAL | Status: DC
  Administered 2018-10-25: 5 mg via ORAL
  Filled 2018-10-25: qty 1

## 2018-10-25 MED ORDER — INSULIN ASPART 100 UNIT/ML ~~LOC~~ SOLN: 0.0000 [IU] | Freq: Three times a day (TID) | SUBCUTANEOUS | Status: DC

## 2018-10-25 MED ORDER — HYDRALAZINE HCL 20 MG/ML IJ SOLN: 10.0000 mg | Freq: Four times a day (QID) | INTRAMUSCULAR | Status: DC | PRN

## 2018-10-25 MED ORDER — METOPROLOL SUCCINATE ER 50 MG PO TB24
50.0000 mg | ORAL_TABLET | Freq: Every day | ORAL | Status: DC
  Administered 2018-10-25 – 2018-10-28 (×4): 50 mg via ORAL
  Filled 2018-10-25 (×4): qty 1

## 2018-10-25 MED ORDER — CHLORHEXIDINE GLUCONATE CLOTH 2 % EX PADS
6.0000 | MEDICATED_PAD | Freq: Every morning | CUTANEOUS | Status: DC
  Administered 2018-10-25: 6 via TOPICAL

## 2018-10-25 NOTE — Progress Notes (Signed)
PROGRESS NOTE    Kyle Harrison  QMV:[id number] DOB: Jul 12, 1980 DOA: 10/23/2018 PCP: Patient, No Pcp Per   Brief Narrative:  38 yo male with hx of  HTN, DM, OSA on CPAP, morbid obesity, diastolic CHF, pulmonary HTN presented to outside hospital with 2 weeks of worsening shortness of breath and lower extremity edema. Was hypoxic on arrival to OSH , placed on BIPAP and CO2 noted to be over 90, patient was altered and was intubated, given lasix and transferred to [facility] under PCCM in ICU.  He was thought to be having acute on chronic congestive heart failure, diastolic.  Patient was eventually extubated on 10/24/2018 and was transferred under tried hospitalist service starting 10/25/2018. [id number] test was negative.  Consultants:   None  Procedures:   Intubation  Antimicrobials:   None   Subjective: Patient seen and examined.  He complains of left foot pain that is been going on for last couple of days according to him.  Has some shortness of breath but feels much better.  No other complaint.  Objective: Vitals:   10/25/18 0800 10/25/18 0820 10/25/18 0900 10/25/18 1015  BP: (!) 136/91  (!) 131/91 (!) 130/96  Pulse: 85  90 80  Resp: Temp:      TempSrc:      SpO2: 100% 100% 100% 96%  Weight:      Height:        Intake/Output Summary (Last 24 hours) at 10/25/2018 1043 Last data filed at 10/25/2018 1003 Gross per 24 hour  Intake 502.5 ml  Output 2400 ml  Net -1897.5 ml   Filed Weights   10/23/18 1657 10/24/18 0413 10/25/18 0500  Weight: (!) 167.8 kg (!) 163.4 kg (!) 161.2 kg    Examination:  General exam: Appears calm and comfortable, morbidly obese Respiratory system: Diminished breath sounds at the bases due to large body habitus. Respiratory effort normal. Cardiovascular system: S1 & S2 heard, RRR. No JVD, murmurs, rubs, gallops or clicks.  +1 pitting edema bilateral lower extremity Gastrointestinal system: Abdomen is nondistended, soft and  nontender. No organomegaly or masses felt. Normal bowel sounds heard. Central nervous system: Alert and oriented. No focal neurological deficits. Extremities: Symmetric 5 x 5 power.  Has point tenderness on the medial malleolus of the left foot Skin: No rashes, lesions or ulcers Psychiatry: Judgement and insight appear normal. Mood & affect appropriate.    Data Reviewed: I have personally reviewed following labs and imaging studies  CBC: Recent Labs  Lab 10/23/18 1659 10/23/18 1935 10/23/18 2254 10/23/18 2348 10/24/18 0339 10/24/18 0440  WBC 6.8  --   --  5.8 8.1  --   NEUTROABS 4.5  --   --   --   --   --   HGB 12.9* 15.0 14.3 13.3 13.3 14.6  HCT 42.4 44.0 42.0 42.0 42.3 43.0  MCV 94.2  --   --  91.5 92.0  --   PLT 163  --   --  167 147*  --    Basic Metabolic Panel: Recent Labs  Lab 10/23/18 1659  10/23/18 2348 10/24/18 0339 10/24/18 0440 10/24/18 2016 10/25/18 0308  NA 139   < > 139 139 137 138 139  K 3.6   < > 4.1 3.9 3.2* 3.6 4.2  CL 96*  --  95* 96*  --  96* 96*  CO2 35*  --  34* 32  --  34* 36*  GLUCOSE 151*  --  92 92  --  94 112*  BUN 16  --  14 13  --  14 17  CREATININE 1.73*  --  1.90* 1.94*  --  2.21* 2.40*  CALCIUM 8.8*  --  8.9 9.0  --  8.9 9.1  MG  --   --   --  2.1  --   --   --   PHOS  --   --  2.6 2.7  --   --   --    < > = values in this interval not displayed.   GFR: Estimated Creatinine Clearance: 64.8 mL/min (A) (by C-G formula based on SCr of 2.4 mg/dL (H)). Liver Function Tests: Recent Labs  Lab 10/23/18 1659 10/23/18 2348  AST 41  --   ALT 17  --   ALKPHOS 43  --   BILITOT 1.0  --   PROT 7.5  --   ALBUMIN 3.6 3.4*   No results for input(s): LIPASE, AMYLASE in the last 168 hours. No results for input(s): AMMONIA in the last 168 hours. Coagulation Profile: No results for input(s): INR, PROTIME in the last 168 hours. Cardiac Enzymes: Recent Labs  Lab 10/23/18 1659 10/23/18 2348  CKTOTAL  --  1,738*  TROPONINI 0.04* 0.03*    BNP (last 3 results) No results for input(s): PROBNP in the last 8760 hours. HbA1C: Recent Labs    10/23/18 2348 10/25/18 0308  HGBA1C 6.1* 6.2*   CBG: Recent Labs  Lab 10/24/18 1133 10/24/18 1533 10/25/18 0100 10/25/18 0506 10/25/18 0718  GLUCAP 53* 86 113* 95 78   Lipid Profile: No results for input(s): CHOL, HDL, LDLCALC, TRIG, CHOLHDL, LDLDIRECT in the last 72 hours. Thyroid Function Tests: No results for input(s): TSH, T4TOTAL, FREET4, T3FREE, THYROIDAB in the last 72 hours. Anemia Panel: No results for input(s): VITAMINB12, FOLATE, FERRITIN, TIBC, IRON, RETICCTPCT in the last 72 hours. Sepsis Labs: Recent Labs  Lab 10/23/18 2348 10/24/18 0339 10/25/18 0308  PROCALCITON <0.10 <0.10 <0.10  LATICACIDVEN 2.3* 2.1*  --     Recent Results (from the past 240 hour(s))  SARS Coronavirus 2 (Hosp order,Performed [facility] via [employer] ID)     Status: None   Collection Time: 10/23/18  5:00 PM   Specimen: Dry Nasal Swab ([employer] ID Now)  Result Value Ref Range Status   SARS Coronavirus 2 ([employer] ID Now) NEGATIVE NEGATIVE Final    Comment: (NOTE) SARS-CoV-2 target nucleic acids are NOT DETECTED. The SARS-CoV-2 RNA is generally detectable in upper and lower respiratory specimens during the acute phase of infection.  Negativeresults do not preclude SARS-CoV-2 infection, do not rule out coinfections with other pathogens, and should not be used as the  sole basis for treatment or other patient management decisions.  Negative results must be combined with clinical observations, patient history, and epidemiological information. The expected result is Negative. Fact Sheet for Patients: http://www.graves-ford.org/https://www.fda.gov/media/136524/download Fact Sheet for Healthcare Providers: EnviroConcern.sihttps://www.fda.gov/media/136523/download This test is not yet approved or cleared by the [place] FDA and  has been authorized for detection and/or diagnosis of SARS-CoV-2 by FDA under an Emergency  Use Authorization (EUA).  This EUA will remain in effect (meaning this test can be used) for the duration of  the COVID19 declaration under Section 5 64(b)(1) of the Act, 21 U.S.C.  section [phone] 3(b)(1), unless the authorization is terminated or revoked sooner. Performed at [facility] [place], [address] [place], [place] [address]   MRSA PCR Screening  Status: None   Collection Time: 10/23/18  9:29 PM   Specimen: Nasal Mucosa; Nasopharyngeal  Result Value Ref Range Status   MRSA by PCR NEGATIVE NEGATIVE Final    Comment:        The GeneXpert MRSA Assay (FDA approved for NASAL specimens only), is one component of a comprehensive MRSA colonization surveillance program. It is not intended to diagnose MRSA infection nor to guide or monitor treatment for MRSA infections. Performed at [facility], [address] [place], [place] [address]   Culture, respiratory (tracheal aspirate)     Status: None (Preliminary result)   Collection Time: 10/23/18 11:27 PM   Specimen: Tracheal Aspirate; Respiratory  Result Value Ref Range Status   Specimen Description TRACHEAL ASPIRATE  Final   Special Requests NONE  Final   Gram Stain   Final    FEW WBC PRESENT, PREDOMINANTLY PMN FEW GRAM NEGATIVE RODS RARE GRAM POSITIVE COCCI    Culture   Final    CULTURE REINCUBATED FOR BETTER GROWTH Performed at [facility], [address], [place], [place] [address]    Report Status PENDING  Incomplete      Radiology Studies: Dg Chest Port 1 View  Result Date: 10/24/2018 CLINICAL DATA:  Intubation. EXAM: PORTABLE CHEST 1 VIEW COMPARISON:  10/23/2018. FINDINGS: Endotracheal tube, NG tube in stable position. Stable cardiomegaly. Dense consolidation/infiltrate left lung base. This is worsened from prior exam. Mild infiltrate right lung base also noted on today's exam. Probable left pleural effusion. No pneumothorax. Degenerative change thoracic spine. IMPRESSION: 1.   Endotracheal tube and NG tube in stable position. 2.  Stable cardiomegaly. 3. Dense consolidation/infiltrate left lung base. This has worsened from prior exam. Left pleural effusion may also be present. Mild right base infiltrate also noted on today's exam. Electronically Signed   By: Maisie Fus  Register   On: 10/24/2018 07:04   Dg Chest Portable 1 View  Result Date: 10/23/2018 CLINICAL DATA:  Intubated EXAM: PORTABLE CHEST 1 VIEW COMPARISON:  10/23/2018, 06/13/2018 FINDINGS: Interval intubation, tip of the endotracheal tube is about 3.7 cm superior to the carina. Esophageal tube tip below the diaphragm but non included. Cardiomegaly with vascular congestion and bilateral interstitial and ground-glass opacity, suspect for mild edema. IMPRESSION: 1. Endotracheal tube tip about 3.7 cm superior to carina. Esophageal tube tip below the diaphragm but non included 2. Cardiomegaly with vascular congestion and mild diffuse bilateral interstitial and ground-glass opacities suspect for edema Electronically Signed   By: Jasmine Pang M.D.   On: 10/23/2018 19:17   Dg Chest Portable 1 View  Result Date: 10/23/2018 CLINICAL DATA:  Acute shortness of breath EXAM: PORTABLE CHEST 1 VIEW COMPARISON:  06/13/2018 and prior radiographs FINDINGS: Cardiomegaly and pulmonary vascular congestion noted. There is no evidence of focal airspace disease, pulmonary edema, suspicious pulmonary nodule/mass, pleural effusion, or pneumothorax. No acute bony abnormalities are identified. IMPRESSION: Cardiomegaly with pulmonary vascular congestion. Electronically Signed   By: Harmon Pier M.D.   On: 10/23/2018 18:11   Vas [place] Lower Extremity Venous (dvt)  Result Date: 10/24/2018  Lower Venous Study Indications: Swelling, and SOB.  Limitations: Body habitus and poor ultrasound/tissue interface. Performing Technologist: Blanch Media RVS  Examination Guidelines: A complete evaluation includes B-mode imaging, spectral Doppler, color Doppler, and  power Doppler as needed of all accessible portions of each vessel. Bilateral testing is considered an integral part of a complete examination. Limited examinations for reoccurring indications may be performed as noted.  +---------+---------------+---------+-----------+----------+--------------+  RIGHT  Compressibility Phasicity Spontaneity Properties Summary         +---------+---------------+---------+-----------+----------+--------------+  CFV       Full            Yes       Yes                                    +---------+---------------+---------+-----------+----------+--------------+  SFJ       Full                                                             +---------+---------------+---------+-----------+----------+--------------+  FV Prox   Full                                                             +---------+---------------+---------+-----------+----------+--------------+  FV Mid    Full                                                             +---------+---------------+---------+-----------+----------+--------------+  FV Distal Full                                                             +---------+---------------+---------+-----------+----------+--------------+  PFV       Full                                                             +---------+---------------+---------+-----------+----------+--------------+  POP       Full            Yes       Yes                                    +---------+---------------+---------+-----------+----------+--------------+  PTV                                                        Not visualized  +---------+---------------+---------+-----------+----------+--------------+  PERO                                                       Not  visualized  +---------+---------------+---------+-----------+----------+--------------+   +---------+---------------+---------+-----------+----------+--------------+  LEFT       Compressibility Phasicity Spontaneity Properties Summary         +---------+---------------+---------+-----------+----------+--------------+  CFV       Full            Yes       Yes                                    +---------+---------------+---------+-----------+----------+--------------+  SFJ       Full                                                             +---------+---------------+---------+-----------+----------+--------------+  FV Prox   Full                                                             +---------+---------------+---------+-----------+----------+--------------+  FV Mid    Full                                                             +---------+---------------+---------+-----------+----------+--------------+  FV Distal Full                                                             +---------+---------------+---------+-----------+----------+--------------+  PFV       Full                                                             +---------+---------------+---------+-----------+----------+--------------+  POP       Full            Yes       Yes                                    +---------+---------------+---------+-----------+----------+--------------+  PTV       Full                                                             +---------+---------------+---------+-----------+----------+--------------+  PERO  Not visualized  +---------+---------------+---------+-----------+----------+--------------+     Summary: Right: There is no evidence of deep vein thrombosis in the lower extremity. However, portions of this examination were limited- see technologist comments above. No cystic structure found in the popliteal fossa. Left: There is no evidence of deep vein thrombosis in the lower extremity. However, portions of this examination were limited- see technologist comments above. No cystic structure found in the popliteal fossa.  *See  table(s) above for measurements and observations.    Preliminary     Scheduled Meds:  chlorhexidine  15 mL Mouth Rinse BID   Chlorhexidine Gluconate Cloth  6 each Topical q morning - 10a   colchicine  0.6 mg Oral Once   furosemide  60 mg Intravenous Q12H   heparin  5,000 Units Subcutaneous Q8H   insulin aspart  0-15 Units Subcutaneous TID WC   ipratropium-albuterol  3 mL Nebulization Q6H   levothyroxine  75 mcg Oral Q0600   mouth rinse  15 mL Mouth Rinse q12n4p   Ensure Max Protein  11 oz Oral BID   Continuous Infusions:  sodium chloride 10 mL/hr at 10/24/18 1100     LOS: 2 days   Assessment & Plan:   Principal Problem:   Acute on chronic respiratory failure with hypoxia and hypercapnia (HCC) Active Problems:   Respiratory failure (HCC)   Morbid (severe) obesity due to excess calories (HCC)   Acute on chronic diastolic CHF (congestive heart failure) (HCC)   Hypertension associated with diabetes (HCC)   CKD (chronic kidney disease) stage 3, GFR 30-59 ml/min (HCC)  Acute hypoxic and hypercapnic respiratory failure likely secondary to acute on chronic combined systolic and diastolic congestive heart failure: Prior notes from PCCM indicates that patient has acute on chronic hypoxic respiratory failure however when talking with the patient, he denies being on any sort of oxygen before so this is acute hypoxic and hypercapnic respiratory failure.  Currently he is on 3 L of nasal cannula saturating 97%.  Discussed with the nurses, we are going to taper it down to 1 L now.  Echo was done which shows borderline ejection fraction with some diastolic dysfunction.  Continue Lasix 60 twice daily with a strict I's and O's.  I have consulted cardiology to evaluate him for possible cardiac catheterization.  Acute encephalopathy: This was likely secondary to CO2 narcosis and this has resolved.  He is alert and oriented.  History of Graves' disease with hypothyroidism: Continue  Synthroid.  Prediabetes: Not on any medications prior to hospitalization.  Current hemoglobin A1c 6.2.  Will start him on SSI.  History of hypertension: Seems to be on lisinopril from home but he has been off of that and his blood pressure is now creeping up.  Has elevated creatinine, unsure whether this is acute versus chronic kidney disease.  He denies any information about having chronic kidney disease.  Due to this, we will continue to hold his lisinopril and start him on amlodipine 5 mg as well as PRN hydralazine.  DVT prophylaxis: Heparin Code Status: Full code Family Communication: None available at the bedside.  Patient alert and oriented and competent.  Discussed with patient. Disposition Plan: To be determined pending further work-up in cardiac consultation.   Time spent: 32 minutes   Darliss Cheney, MD [facility] Pager [phone]  If 7PM-7AM, please contact night-coverage [email or web address] Password [facility] 10/25/2018, 10:43 AM

## 2018-10-25 NOTE — Progress Notes (Signed)
Pt is not on BiPAP at this time.

## 2018-10-25 NOTE — Evaluation (Signed)
Physical Therapy Evaluation Patient Details Name: Kyle Harrison MRN: [id number] DOB: 01/26/1981 Today's Date: 10/25/2018   History of Present Illness  38 yo male with hx of COPD, HTN, DM, OSA on CPAP, morbid obesity, diastolic CHF, pulmonary HTN presenting to outside hospital with 2 weeks of worsening shortness of breath and lower extremity edema.   Clinical Impression  Pt admitted with above. Pt sedentary at baseline but was able to do all ADLs and amb without AD. Pt with mild SOB with amb and maintained SPO2 >90% on RA during ambulation. Pt with report of RW assisting with L LE pain relief allowing increased ambulation tolerance. Pt will need to complete full flight stair negotiation prior to d/c home once medically stable as pt is on 2nd floor apartment. Acute PT to cont to follow.    Follow Up Recommendations No PT follow up;Supervision - Intermittent    Equipment Recommendations  Rolling walker with 5" wheels    Recommendations for Other Services       Precautions / Restrictions Precautions Precautions: Fall Restrictions Weight Bearing Restrictions: No      Mobility  Bed Mobility               General bed mobility comments: pt up in chair upon PT arrival  Transfers Overall transfer level: Needs assistance Equipment used: None Transfers: Sit to/from Stand Sit to Stand: Min guard         General transfer comment: min guard for safety due to L LE pain and pt's body habitus however no physical assist needed  Ambulation/Gait Ambulation/Gait assistance: Min guard Gait Distance (Feet): 120 Feet Assistive device: Rolling walker (2 wheeled) Gait Pattern/deviations: Step-through pattern;Decreased stride length;Wide base of support;Antalgic Gait velocity: decreased Gait velocity interpretation: 1.31 - 2.62 ft/sec, indicative of limited community ambulator General Gait Details: pt with L LE limp due to L LE pain, pt reports at 7/10, pt did say it improved with  ambulation. Pt reports that the walker helped relieve some of the pain in the L LE. Pt did walk a few feet int he room without RW and was reaching for wall and counters to hold onto  [employer]            Wheelchair Mobility    Modified Rankin (Stroke Patients Only)       Balance Overall balance assessment: Mild deficits observed, not formally tested                                           Pertinent Vitals/Pain Pain Assessment: 0-10 Pain Score: 7  Pain Location: L LE Pain Descriptors / Indicators: Sharp Pain Intervention(s): Monitored during session    Home Living Family/patient expects to be discharged to:: Private residence Living Arrangements: (brother) Available Help at Discharge: Family;Available PRN/intermittently(brother works) Type of Home: Apartment Home Access: Stairs to enter Entrance Stairs-Rails: Can reach both Secretary/administratorntrance Stairs-Number of Steps: 12 Home Layout: One level Home Equipment: None      Prior Function Level of Independence: Independent         Comments: doesn't drive     Hand Dominance   Dominant Hand: Right    Extremity/Trunk Assessment   Upper Extremity Assessment Upper Extremity Assessment: Overall WFL for tasks assessed    Lower Extremity Assessment Lower Extremity Assessment: Generalized weakness    Cervical / Trunk Assessment Cervical / Trunk Assessment: Normal  Communication  Communication: No difficulties  Cognition Arousal/Alertness: Awake/alert Behavior During Therapy: WFL for tasks assessed/performed Overall Cognitive Status: Within Functional Limits for tasks assessed                                 General Comments: pt with delayed response however suspect this to be his baseline, pt with muffled speech as well      General Comments General comments (skin integrity, edema, etc.): pt with bilat LE edema and L LE worse than R    Exercises     Assessment/Plan    PT  Assessment Patient needs continued PT services  PT Problem List Decreased strength;Decreased range of motion;Decreased activity tolerance;Decreased balance;Decreased mobility;Cardiopulmonary status limiting activity       PT Treatment Interventions DME instruction;Gait training;Stair training;Functional mobility training;Therapeutic activities;Therapeutic exercise;Balance training;Neuromuscular re-education    PT Goals (Current goals can be found in the Care Plan section)  Acute Rehab PT Goals Patient Stated Goal: get better PT Goal Formulation: With patient Time For Goal Achievement: 11/08/18 Potential to Achieve Goals: Good    Frequency Min 3X/week   Barriers to discharge        Co-evaluation               AM-PAC PT "6 Clicks" Mobility  Outcome Measure Help needed turning from your back to your side while in a flat bed without using bedrails?: None Help needed moving from lying on your back to sitting on the side of a flat bed without using bedrails?: None Help needed moving to and from a bed to a chair (including a wheelchair)?: A Little Help needed standing up from a chair using your arms (e.g., wheelchair or bedside chair)?: A Little Help needed to walk in hospital room?: A Little Help needed climbing 3-5 steps with a railing? : A Little 6 Click Score: 20    End of Session Equipment Utilized During Treatment: Gait belt Activity Tolerance: Patient tolerated treatment well Patient left: in chair;with call bell/phone within reach;with nursing/sitter in room Nurse Communication: Mobility status PT Visit Diagnosis: Unsteadiness on feet (R26.81);Difficulty in walking, not elsewhere classified (R26.2)    Time: 8657-8469 PT Time Calculation (min) (ACUTE ONLY): 13 min   Charges:   PT Evaluation $PT Eval Low Complexity: 1 Low          Kittie Plater, PT, DPT Acute Rehabilitation Services Pager #: [phone] Office #: [phone]'  [place] 10/25/2018, 1:07  PM

## 2018-10-25 NOTE — Progress Notes (Signed)
Hypoglycemic Event  CBG: 58   Treatment: 4 oz juice/soda     Symptoms: None  Follow-up CBG:   Patient transferred, receiving RN informed of hypoglycemia event and follow up CBG needed.   Possible Reasons for Event: Unknown  Comments/MD notified:   Patient's Lunch tray present and patient was in process of transferring to [facility] room 23.  Reported hypoglycemia event and need for post lunch follow up to receiving RN Allayne Gitelman.   Hypoglycemia standing orders placed.    Doretha Sou A

## 2018-10-25 NOTE — Progress Notes (Signed)
Name: Kyle PlateMarlin Bero MRN: [id number] DOB: 10/24/1980  Date of service : 10/23/18 Cc: sob, tx intubated from outside hospital    LOS: 2  [facility] / Critical Care Note   History of Present Illness: 38 yo male with hx of  HTN, DM, OSA on CPAP, morbid obesity, diastolic CHF, pulmonary HTN presenting to outside hospital with 2 weeks of worsening shortness of breath and lower extremity edema. Was hypoxic on arrival to OSH , placed on BIPAP and CO2 noted to be over 90, patient was altered and was intubated, given lasix and [facility]. [phone] test was negative.   Lines / Drains: PIV ETT 6/21 >> 6/22  Cultures: COVID19 NEG   Antibiotics: none   Tests / Events: transferred from outside ER after being intubated 6/21 dupex LE 6/22 neg Echo 6/22 >> EF 5055%.  Mildly reduced LV function.   Past Medical History:  Diagnosis Date  . CHF (congestive heart failure) (HCC)   . Diabetes mellitus without complication (HCC)   . Hypertension   . Smoker     SUBJ  Extubated sitting in chair no acute distress ready to be transferred to a lower level of care.  Trying to assume his care pulmonary critical care available PRN    Vital Signs: Temp:  [97.5 F (36.4 C)-98.1 F (36.7 C)] 97.9 F (36.6 C) (06/23 0725) Pulse Rate:  [66-88] 85 (06/23 0800) Resp:  [0-29] 14 (06/23 0800) BP: (64-141)/(50-107) 136/91 (06/23 0800) SpO2:  [89 %-100 %] 100 % (06/23 0820) FiO2 (%):  [40 %] 40 % (06/22 2310) Weight:  [161.2 kg] 161.2 kg (06/23 0500) I/O last 3 completed shifts: In: 878.3 [P.O.:480; I.V.:398.3] Out: 6275 [Urine:6275]  Physical Examination: General: Morbidly obese male sitting up in chair HEENT: MM pink/moist Neuro: Although somewhat slow today able to follow commands moves all extremities alert and orientated CV: s1s2 rrr, no m/r/g PULM: even/non-labored, lungs bilaterally decreased breath sounds bases GI: Firm, distended, nontender Extremities:  warm/dry, 2+ edema  Skin: no rashes or lesions    10/22/2018 no chest x-ray  Ventilator settings: Vent Mode: PSV;BIPAP FiO2 (%):  [40 %] 40 % Set Rate:  [8 bmp-15 bmp] 15 bmp PEEP:  [5 cmH20-6 cmH20] 6 cmH20 Pressure Support:  [5 cmH20] 5 cmH20 Plateau Pressure:  [22 cmH20] 22 cmH20  Assessment and Plan: Principal Problem:   Acute on chronic respiratory failure with hypoxia and hypercapnia (HCC) Active Problems:   Respiratory failure (HCC)   Morbid (severe) obesity due to excess calories (HCC)   Acute on chronic diastolic CHF (congestive heart failure) (HCC)   Hypertension associated with diabetes (HCC)   CKD (chronic kidney disease) stage 3, GFR 30-59 ml/min (HCC)     Acute on chronic resp failure with hypoxemia and hypercarbia secondary to CHF exacerbation Vs PAP noncompliance. Doubt diagnosis of COPD, no bronchospasm Plan  Extubated 10/24/2018 He will need nocturnal CPAP versus BiPAP support He can transition out of the intensive care unit He can transition to [facility] service D intensify his care Weight loss He will need sleep study in the future   acute toxic encephalopathy secondary to CO2 narcosis Resolved  Acute on chronic diastolic CHF, hx of pulm htn , OSA , non compliant with CPAP for the last 2 weeks.  BNP normal -Continue Lasix 60 every 12 for negative balance -Obtain echo for completion   AKI on  CKD Lab Results  Component Value Date   CREATININE 2.40 (H) 10/25/2018   CREATININE  2.21 (H) 10/24/2018   CREATININE 1.94 (H) 10/24/2018    Monitor creatinine Careful diuresis    Richardson Landry Ikesha Siller ACNP Maryanna Shape PCCM Pager [phone] till 1 pm If no answer page [phone] 10/25/2018, 8:37 AM

## 2018-10-25 NOTE — Consult Note (Signed)
Cardiology Consultation:   Patient ID: Kyle Harrison MRN: [id number]; DOB: 07-24-1980  Admit date: 10/23/2018 Date of Consult: 10/25/2018  Primary Care Provider: Patient, No Pcp Per Primary Cardiologist: No primary care provider on file.  Primary Electrophysiologist:  None    Patient Profile:   Kyle Harrison is a 38 y.o. male with a hx of HTN, DM, OSA on Cpap, morbid obesity, diastolic HF and pulmonary HTN who is being seen today for the evaluation of CHF at the request of Dr. Doristine Bosworth.  History of Present Illness:   Mr. Shin is a 38 yo male with PMH noted above. In talking who is being followed followed through a community clinic in [facility] to receive his care and his medications. He is currently without a job or insurance and has been unable to obtain any medications for quite some time. Has hx of OSA but says he does not have the parts for his machine, therefore unable to use it. Eats lots of fast food and processed meals. Overall he is a poor historian. Over the past couple of weeks he has noticed increasing shortness of breath. Started with mostly activity but progressed to symptoms at rest. Also had developed episodes of chest pain. Felt like a pressure, and seemed to be worse with activity. Complained over increasing LE edema, and abd girth. Also orthopnea and PND. Symptoms worsened and he eventually presented to [facility] on 10/23/18.   On presentation he was reported to drowsy on admission but able to give some history. Rapid COVID was negative and afebrile. His mental status declined and was placed on Bipap in attempts to resolve his respiratory efforts. ABG showed respiratory acidosis and his condition declined requiring intubation. He was transferred to [facility] under the care of PCCM for further management.   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 Other labs were notable for Cr 1.9, BNP 17, Hgb 13.3, Hgb A1c 6.1, lactic acid 2.3. He was diuresed with IV lasix 60mg  BID, net -5.6L.  Able to be weaned and extubated on 10/24/18. Follow up echo showed EF of 50-55% with LVH. Cr increased from 1.9>>1.94>>2.21>>2.40 with diuresis. He was transferred to telemetry on 6/23 and [facility] assumed care. He was weaned to [facility] at 3L with sats in the upper 90s. In regards to his blood pressure, this has been relatively controlled. Home meds have lisinopril listed, along with lasix 20mg  daily. Here he has been placed on norvasc 5mg  daily. Cardiology has been consulted in regards to his CHF.   Heart Pathway Score:     Past Medical History:  Diagnosis Date  . CHF (congestive heart failure) (HCC)    Preserved EF  . Diabetes mellitus without complication ([place])   . Hypertension   . Morbid obesity ([place])   . Smoker     History reviewed. No pertinent surgical history.    Inpatient Medications: Scheduled Meds: . amLODipine  5 mg Oral Daily  . furosemide  60 mg Intravenous Q12H  . heparin  5,000 Units Subcutaneous Q8H  . insulin aspart  0-5 Units Subcutaneous QHS  . insulin aspart  0-9 Units Subcutaneous TID WC  . ipratropium-albuterol  3 mL Nebulization Q6H  . levothyroxine  75 mcg Oral Q0600  . Ensure Max Protein  11 oz Oral BID   Continuous Infusions: . sodium chloride 10 mL/hr at 10/24/18 1100   PRN Meds: acetaminophen, hydrALAZINE, ibuprofen, ondansetron (ZOFRAN) IV  Allergies:   No Known Allergies  Social History:   Social History  Socioeconomic History  . Marital status: Single    Spouse name: Not on file  . Number of children: Not on file  . Years of education: Not on file  . Highest education level: Not on file  Occupational History  . Not on file  Social Needs  . Financial resource strain: Not on file  . Food insecurity    Worry: Not on file    Inability: Not on file  . Transportation needs    Medical: Not on file    Non-medical: Not on file  Tobacco Use  . Smoking status: Current Every Day Smoker    Packs/day: 0.50  . Smokeless tobacco: Never Used  Substance  and Sexual Activity  . Alcohol use: No  . Drug use: No  . Sexual activity: Never  Lifestyle  . Physical activity    Days per week: Not on file    Minutes per session: Not on file  . Stress: Not on file  Relationships  . Social Musicianconnections    Talks on phone: Not on file    Gets together: Not on file    Attends religious service: Not on file    Active member of club or organization: Not on file    Attends meetings of clubs or organizations: Not on file    Relationship status: Not on file  . Intimate partner violence    Fear of current or ex partner: Not on file    Emotionally abused: Not on file    Physically abused: Not on file    Forced sexual activity: Not on file  Other Topics Concern  . Not on file  Social History Narrative  . Not on file    Family History:   Family History  Problem Relation Age of Onset  . Graves' disease Mother   . Hypertension Mother      ROS:  Please see the history of present illness.   All other ROS reviewed and negative.     Physical Exam/Data:   Vitals:   10/25/18 0900 10/25/18 1015 10/25/18 1100 10/25/18 1152  BP: (!) 131/91 (!) 130/96  (!) 152/109  Pulse: 90 80  82  Resp: 15 17  20   Temp:   97.7 F (36.5 C)   TempSrc:   Oral   SpO2: 100% 96%  97%  Weight:      Height:        Intake/Output Summary (Last 24 hours) at 10/25/2018 1444 Last data filed at 10/25/2018 1003 Gross per 24 hour  Intake 720 ml  Output 1600 ml  Net -880 ml   Last 3 Weights 10/25/2018 10/24/2018 10/23/2018  Weight (lbs) 355 lb 6.1 oz 360 lb 3.7 oz 370 lb  Weight (kg) 161.2 kg 163.4 kg 167.831 kg     Body mass index is 49.57 kg/m.   Physical exam per MD:  General:  Well nourished, well developed, in no acute distress HEENT: normal Lymph: no adenopathy Neck: no JVD Endocrine:  No thryomegaly Vascular: No carotid bruits; FA pulses 2+ bilaterally without bruits  Cardiac:  normal S1, S2; RRR; no murmur  Lungs:  clear to auscultation bilaterally, no  wheezing, rhonchi or rales  Abd: soft, nontender, no hepatomegaly, morbidly obese Ext: moderate edema Musculoskeletal:  No deformities, BUE and BLE strength normal and equal Skin: warm and dry  Neuro:  CNs 2-12 intact, no focal abnormalities noted Psych:  Normal affect   EKG:  The EKG was personally reviewed and demonstrates:  SR with RBBB  and LAFB   Relevant CV Studies:  TTE: 10/24/18  IMPRESSIONS    1. The left ventricle has low normal systolic function, with an ejection fraction of 50-55%. The cavity size was normal. There is moderately increased left ventricular wall thickness. Left ventricular diastolic parameters were normal.  2. The mitral valve is grossly normal.  3. The tricuspid valve is grossly normal.  4. The aortic valve is tricuspid. No stenosis of the aortic valve.  5. Low normal to mildly reduced LV systolic function; moderate LVH; prominent apical trabeculae; would consider cardiac MR to R/O noncompaction.  Laboratory Data:  High Sensitivity Troponin:  No results for input(s): TROPONINIHS in the last 720 hours.   Cardiac Enzymes Recent Labs  Lab 10/23/18 1659 10/23/18 2348  TROPONINI 0.04* 0.03*   No results for input(s): TROPIPOC in the last 168 hours.  Chemistry Recent Labs  Lab 10/24/18 0339 10/24/18 0440 10/24/18 2016 10/25/18 0308  NA 139 137 138 139  K 3.9 3.2* 3.6 4.2  CL 96*  --  96* 96*  CO2 32  --  34* 36*  GLUCOSE 92  --  94 112*  BUN 13  --  14 17  CREATININE 1.94*  --  2.21* 2.40*  CALCIUM 9.0  --  8.9 9.1  GFRNONAA 43*  --  36* 33*  GFRAA 49*  --  42* 38*  ANIONGAP 11  --  8 7    Recent Labs  Lab 10/23/18 1659 10/23/18 2348  PROT 7.5  --   ALBUMIN 3.6 3.4*  AST 41  --   ALT 17  --   ALKPHOS 43  --   BILITOT 1.0  --    Hematology Recent Labs  Lab 10/23/18 1659  10/23/18 2348 10/24/18 0339 10/24/18 0440  WBC 6.8  --  5.8 8.1  --   RBC 4.50  --  4.59 4.60  --   HGB 12.9*   < > 13.3 13.3 14.6  HCT 42.4   < > 42.0  42.3 43.0  MCV 94.2  --  91.5 92.0  --   MCH 28.7  --  29.0 28.9  --   MCHC 30.4  --  31.7 31.4  --   RDW 15.7*  --  15.7* 15.5  --   PLT 163  --  167 147*  --    < > = values in this interval not displayed.   BNP Recent Labs  Lab 10/23/18 1659  BNP 17.6    DDimer No results for input(s): DDIMER in the last 168 hours.   Radiology/Studies:  Dg Chest Port 1 View  Result Date: 10/24/2018 CLINICAL DATA:  Intubation. EXAM: PORTABLE CHEST 1 VIEW COMPARISON:  10/23/2018. FINDINGS: Endotracheal tube, NG tube in stable position. Stable cardiomegaly. Dense consolidation/infiltrate left lung base. This is worsened from prior exam. Mild infiltrate right lung base also noted on today's exam. Probable left pleural effusion. No pneumothorax. Degenerative change thoracic spine. IMPRESSION: 1.  Endotracheal tube and NG tube in stable position. 2.  Stable cardiomegaly. 3. Dense consolidation/infiltrate left lung base. This has worsened from prior exam. Left pleural effusion may also be present. Mild right base infiltrate also noted on today's exam. Electronically Signed   By: Maisie Fus  Register   On: 10/24/2018 07:04   Dg Chest Portable 1 View  Result Date: 10/23/2018 CLINICAL DATA:  Intubated EXAM: PORTABLE CHEST 1 VIEW COMPARISON:  10/23/2018, 06/13/2018 FINDINGS: Interval intubation, tip of the endotracheal tube is about 3.7 cm superior to  the carina. Esophageal tube tip below the diaphragm but non included. Cardiomegaly with vascular congestion and bilateral interstitial and ground-glass opacity, suspect for mild edema. IMPRESSION: 1. Endotracheal tube tip about 3.7 cm superior to carina. Esophageal tube tip below the diaphragm but non included 2. Cardiomegaly with vascular congestion and mild diffuse bilateral interstitial and ground-glass opacities suspect for edema Electronically Signed   By: Jasmine Pang M.D.   On: 10/23/2018 19:17   Dg Chest Portable 1 View  Result Date: 10/23/2018 CLINICAL DATA:   Acute shortness of breath EXAM: PORTABLE CHEST 1 VIEW COMPARISON:  06/13/2018 and prior radiographs FINDINGS: Cardiomegaly and pulmonary vascular congestion noted. There is no evidence of focal airspace disease, pulmonary edema, suspicious pulmonary nodule/mass, pleural effusion, or pneumothorax. No acute bony abnormalities are identified. IMPRESSION: Cardiomegaly with pulmonary vascular congestion. Electronically Signed   By: Harmon Pier M.D.   On: 10/23/2018 18:11   Vas [place] Lower Extremity Venous (dvt)  Result Date: 10/25/2018  Lower Venous Study Indications: Swelling, and SOB.  Limitations: Body habitus and poor ultrasound/tissue interface. Comparison Study: no prior Performing Technologist: Blanch Media RVS  Examination Guidelines: A complete evaluation includes B-mode imaging, spectral Doppler, color Doppler, and power Doppler as needed of all accessible portions of each vessel. Bilateral testing is considered an integral part of a complete examination. Limited examinations for reoccurring indications may be performed as noted.  +---------+---------------+---------+-----------+----------+--------------+ RIGHT    CompressibilityPhasicitySpontaneityPropertiesSummary        +---------+---------------+---------+-----------+----------+--------------+ CFV      Full           Yes      Yes                                 +---------+---------------+---------+-----------+----------+--------------+ SFJ      Full                                                        +---------+---------------+---------+-----------+----------+--------------+ FV Prox  Full                                                        +---------+---------------+---------+-----------+----------+--------------+ FV Mid   Full                                                        +---------+---------------+---------+-----------+----------+--------------+ FV DistalFull                                                         +---------+---------------+---------+-----------+----------+--------------+ PFV      Full                                                        +---------+---------------+---------+-----------+----------+--------------+  POP      Full           Yes      Yes                                 +---------+---------------+---------+-----------+----------+--------------+ PTV                                                   Not visualized +---------+---------------+---------+-----------+----------+--------------+ PERO                                                  Not visualized +---------+---------------+---------+-----------+----------+--------------+   +---------+---------------+---------+-----------+----------+--------------+ LEFT     CompressibilityPhasicitySpontaneityPropertiesSummary        +---------+---------------+---------+-----------+----------+--------------+ CFV      Full           Yes      Yes                                 +---------+---------------+---------+-----------+----------+--------------+ SFJ      Full                                                        +---------+---------------+---------+-----------+----------+--------------+ FV Prox  Full                                                        +---------+---------------+---------+-----------+----------+--------------+ FV Mid   Full                                                        +---------+---------------+---------+-----------+----------+--------------+ FV DistalFull                                                        +---------+---------------+---------+-----------+----------+--------------+ PFV      Full                                                        +---------+---------------+---------+-----------+----------+--------------+ POP      Full           Yes      Yes                                  +---------+---------------+---------+-----------+----------+--------------+  PTV      Full                                                        +---------+---------------+---------+-----------+----------+--------------+ PERO                                                  Not visualized +---------+---------------+---------+-----------+----------+--------------+     Summary: Right: There is no evidence of deep vein thrombosis in the lower extremity. However, portions of this examination were limited- see technologist comments above. No cystic structure found in the popliteal fossa. Left: There is no evidence of deep vein thrombosis in the lower extremity. However, portions of this examination were limited- see technologist comments above. No cystic structure found in the popliteal fossa.  *See table(s) above for measurements and observations. Electronically signed by Waverly Ferrari MD on 10/25/2018 at 11:45:42 AM.    Final     Assessment and Plan:   Roshun Klingensmith is a 38 y.o. male with a hx of HTN, DM, OSA on Cpap, morbid obesity, diastolic HF and pulmonary HTN who is being seen today for the evaluation of CHF at the request of Dr. Jacqulyn Bath.  1. Acute on chronic diastolic HF: Seems multifactorial as patient was not taking meds PTA, does not adhere to dietary recommendations, had OSA but not using Cpap. His BNP was 17, but CXR with concern for edema. He has diuresed and now net - 5.6L. Cr is starting to rise. Needs to be on guide line directed medical therapy and would favor starting BB therapy as his blood pressure is stable. Consider stopping Norvasc given his LE edema. Unclear what his dry weight maybe. Breathing has improved but still dyspneic with conversation. Defer to MD whether patient would benefit from RHC.   2. CKD with AKI: Cr 1.9>>2.4 now. Notes in care everywhere indicate prior Cr levels of 1.5-1.6.   2. OSA: non complaint with Cpap as he states he does not have the parts for  his machine. Would benefit from up dated outpatient sleep study.   3. HTN: stable, recommendations as above  4. Pre diabetic: on SSI, diabetes coordinator following  5. Morbid Obesity: BMI 49, discussed need for agressive lifestyle modifications.   6. Tobacco use: discussed cessation   For questions or updates, please contact [facility] Please consult [email or web address] for contact info under     Signed, Rollene Rotunda, MD  10/25/2018 2:44 PM   History and all data above reviewed.  Patient examined.  This is a very pleasant gentleman with respiratory failure and likely hypoventilation obesity syndrome.  He presents with increased dyspnea and eventual intubation.  Now extubated with diuresis but progressive renal insufficiency.  He says that his legs have been swelling for a long time and he has had progressive DOE.  He lives in a walk down apartment with his brother.  He doesn't watch salt content.  He has severe sleep apnea and has not been able to use the machine because he cannot afford any of the items for upkeep.  I agree with the findings as above.  The patient exam reveals COR:RRR  ,  Lungs: Clear  ,  Abd: Obese,  Ext Moderate Edema  .  All available labs, radiology testing, previous records reviewed. Agree with documented assessment and plan. Hypoventilation obesity syndrome.  With this he will be preload dependent.  I suspect that his pulmonary pressures are elevated and diuresis will be difficult.  I agree with changing to beta blocker.  Ultimately this will need massive social intervention.  In particular he will need help with meds, follow up, dietary education, fluid restriction, keeping his feet elevated and he will need financial assistance with his CPAP.  I am going to stop the diuresis and stop the Norvasc and start beta blocker.  I have not seen any significant bradyarrhythmias though we will watch for this.  Abnormal echo:  Question of non compaction.  I will consider an MRI in  the future but not currently with RI and no chance for contrast.       Rollene RotundaJames Ghazal Pevey  4:42 PM  10/25/2018

## 2018-10-25 NOTE — Care Management (Signed)
CM consult acknowledged for financial concerns. CM left a voice message with Rayetta Humphrey (financial counselor), requesting a returned call to discuss the patients concerns and request the counselor to f/u with the patient. Awaiting a return call.   Midge Minium RN, BSN, NCM-BC, ACM-RN [phone]

## 2018-10-26 LAB — CULTURE, RESPIRATORY W GRAM STAIN: Culture: NORMAL

## 2018-10-26 LAB — BASIC METABOLIC PANEL
Anion gap: 8 (ref 5–15)
BUN: 15 mg/dL (ref 6–20)
CO2: 37 mmol/L — ABNORMAL HIGH (ref 22–32)
Calcium: 9.4 mg/dL (ref 8.9–10.3)
Chloride: 93 mmol/L — ABNORMAL LOW (ref 98–111)
Creatinine, Ser: 1.92 mg/dL — ABNORMAL HIGH (ref 0.61–1.24)
GFR calc Af Amer: 50 mL/min — ABNORMAL LOW (ref >60)
GFR calc non Af Amer: 43 mL/min — ABNORMAL LOW (ref >60)
Glucose, Bld: 162 mg/dL — ABNORMAL HIGH (ref 70–99)
Potassium: 3.7 mmol/L (ref 3.5–5.1)
Sodium: 138 mmol/L (ref 135–145)

## 2018-10-26 LAB — GLUCOSE, CAPILLARY
Glucose-Capillary: 77 mg/dL (ref 70–99)
Glucose-Capillary: 60 mg/dL — ABNORMAL LOW (ref 70–99)
Glucose-Capillary: 64 mg/dL — ABNORMAL LOW (ref 70–99)
Glucose-Capillary: 102 mg/dL — ABNORMAL HIGH (ref 70–99)
Glucose-Capillary: 114 mg/dL — ABNORMAL HIGH (ref 70–99)

## 2018-10-26 MED ORDER — IPRATROPIUM-ALBUTEROL 0.5-2.5 (3) MG/3ML IN SOLN
3.0000 mL | Freq: Three times a day (TID) | RESPIRATORY_TRACT | Status: DC
  Administered 2018-10-26 – 2018-10-28 (×6): 3 mL via RESPIRATORY_TRACT
  Filled 2018-10-26 (×7): qty 3

## 2018-10-26 NOTE — Progress Notes (Signed)
PROGRESS NOTE  Kyle Harrison QJF:[id number] DOB: May 05, 1980 DOA: 10/23/2018 PCP: Patient, No Pcp Per  HPI/Recap of past 24 hours: 38 yo male with hx of HTN, DM, OSA on CPAP, morbid obesity, diastolic CHF, pulmonary HTN presented to outside hospital with 2 weeks of worsening shortness of breath and lower extremity edema. Was hypoxic on arrival to OSH , placed on BIPAP and CO2 noted to be over 90, patient was altered and was intubated, given lasix and transferred to [facility] under PCCM in ICU.  He was thought to be having acute on chronic congestive heart failure, diastolic.  Patient was eventually extubated on 10/24/2018 and was transferred under tried hospitalist service starting 10/25/2018. [id number] test was negative.  10/26/18: Patient seen and examined at his bedside.  Admits to cough.  Concerned about going home without CPAP.  Will do overnight oxygen monitoring.  Assessment/Plan: Principal Problem:   Acute on chronic respiratory failure with hypoxia and hypercapnia (HCC) Active Problems:   Respiratory failure (HCC)   Morbid (severe) obesity due to excess calories (HCC)   Acute on chronic diastolic CHF (congestive heart failure) (HCC)   Hypertension associated with diabetes (HCC)   CKD (chronic kidney disease) stage 3, GFR 30-59 ml/min (HCC)  Acute hypoxic hypercarbic respiratory failure likely multifactorial secondary to acute diastolic CHF Intubated on 10/23/2018 Extubated on 10/24/2018 and transferred to [facility] services on 10/25/18 Cardiology has been consulted and following Net I&O since admission Continue to monitor urine output  AKI on possible CKD 3 Presented with creatinine of 1.73 with GFR 49 Creatinine yesterday 2.40 Improved with creatinine of 1.99 and GFR 50 Continue to avoid nephrotoxins Diuretics on hold due to AKI Defer to cardiology to resume diuretics  Acute diastolic CHF Last 2D echo done on 10/24/2018 showed normal LVEF Cardiac medications per  cardiology  OSA, not on CPAP at home Will obtain overnight O2 monitoring  Hypertension Pressure is normotensive Started on beta-blocker by cardiology Norvasc Dced per cardiology  Morbid obesity BMI 29 Recommend weight loss outpatient with healthy dieting and regular physical activity  Resolved acute encephalopathy likely secondary to CO2 narcosis  History of Graves' disease with hypothyroidism: Continue Synthroid.  Prediabetes: Not on any medications prior to hospitalization.  Current hemoglobin A1c 6.2.  Will start him on SSI.  .  DVT prophylaxis: Heparin subcu 3 times daily Code Status: Full code Family Communication:  Will call if okay with the patient Disposition Plan:  Possible discharge to home tomorrow on 10/27/2018 or when cardiology signs of.   Objective: Vitals:   10/25/18 2001 10/26/18 0600 10/26/18 0743 10/26/18 0745  BP:  (!) 122/102 131/82   Pulse:   80   Resp:   18   Temp:  98.6 F (37 C) 97.8 F (36.6 C)   TempSrc:  Oral    SpO2: (S) (!) 87% (!) 88% 90% 95%  Weight:      Height:        Intake/Output Summary (Last 24 hours) at 10/26/2018 1303 Last data filed at 10/26/2018 0846 Gross per 24 hour  Intake -  Output 350 ml  Net -350 ml   Filed Weights   10/23/18 1657 10/24/18 0413 10/25/18 0500  Weight: (!) 167.8 kg (!) 163.4 kg (!) 161.2 kg    Exam:  . General: 38 y.o. year-old male morbidly obese in no acute distress.  Alert and oriented x3. . Cardiovascular: Regular rate and rhythm with no rubs or gallops.  No thyromegaly or JVD noted.   Marland Kitchen  Respiratory: Clear to auscultation with no wheezes or rales. Good inspiratory effort. . Abdomen: Soft nontender nondistended with normal bowel sounds x4 quadrants. . Musculoskeletal: Trace lower extremity edema. 2/4 pulses in all 4 extremities. . Skin: Bilateral lower extremity edema with hyperpigmentation. Marland Kitchen. Psychiatry: Mood is appropriate for condition and setting   Data Reviewed: CBC: Recent Labs   Lab 10/23/18 1659 10/23/18 1935 10/23/18 2254 10/23/18 2348 10/24/18 0339 10/24/18 0440  WBC 6.8  --   --  5.8 8.1  --   NEUTROABS 4.5  --   --   --   --   --   HGB 12.9* 15.0 14.3 13.3 13.3 14.6  HCT 42.4 44.0 42.0 42.0 42.3 43.0  MCV 94.2  --   --  91.5 92.0  --   PLT 163  --   --  167 147*  --    Basic Metabolic Panel: Recent Labs  Lab 10/23/18 2348 10/24/18 0339 10/24/18 0440 10/24/18 2016 10/25/18 0308 10/26/18 0935  NA 139 139 137 138 139 138  K 4.1 3.9 3.2* 3.6 4.2 3.7  CL 95* 96*  --  96* 96* 93*  CO2 34* 32  --  34* 36* 37*  GLUCOSE 92 92  --  94 112* 162*  BUN 14 13  --  14 17 15   CREATININE 1.90* 1.94*  --  2.21* 2.40* 1.92*  CALCIUM 8.9 9.0  --  8.9 9.1 9.4  MG  --  2.1  --   --   --   --   PHOS 2.6 2.7  --   --   --   --    GFR: Estimated Creatinine Clearance: 80.9 mL/min (A) (by C-G formula based on SCr of 1.92 mg/dL (H)). Liver Function Tests: Recent Labs  Lab 10/23/18 1659 10/23/18 2348  AST 41  --   ALT 17  --   ALKPHOS 43  --   BILITOT 1.0  --   PROT 7.5  --   ALBUMIN 3.6 3.4*   No results for input(s): LIPASE, AMYLASE in the last 168 hours. No results for input(s): AMMONIA in the last 168 hours. Coagulation Profile: No results for input(s): INR, PROTIME in the last 168 hours. Cardiac Enzymes: Recent Labs  Lab 10/23/18 1659 10/23/18 2348  CKTOTAL  --  1,738*  TROPONINI 0.04* 0.03*   BNP (last 3 results) No results for input(s): PROBNP in the last 8760 hours. HbA1C: Recent Labs    10/23/18 2348 10/25/18 0308  HGBA1C 6.1* 6.2*   CBG: Recent Labs  Lab 10/25/18 1228 10/25/18 1556 10/25/18 2059 10/26/18 0736 10/26/18 1150  GLUCAP 91 63* 101* 77 60*   Lipid Profile: No results for input(s): CHOL, HDL, LDLCALC, TRIG, CHOLHDL, LDLDIRECT in the last 72 hours. Thyroid Function Tests: No results for input(s): TSH, T4TOTAL, FREET4, T3FREE, THYROIDAB in the last 72 hours. Anemia Panel: No results for input(s): VITAMINB12,  FOLATE, FERRITIN, TIBC, IRON, RETICCTPCT in the last 72 hours. Urine analysis:    Component Value Date/Time   COLORURINE YELLOW 10/23/2018 1659   APPEARANCEUR CLEAR 10/23/2018 1659   LABSPEC 1.025 10/23/2018 1659   PHURINE 6.5 10/23/2018 1659   GLUCOSEU NEGATIVE 10/23/2018 1659   HGBUR TRACE (A) 10/23/2018 1659   BILIRUBINUR NEGATIVE 10/23/2018 1659   KETONESUR NEGATIVE 10/23/2018 1659   PROTEINUR >300 (A) 10/23/2018 1659   NITRITE NEGATIVE 10/23/2018 1659   LEUKOCYTESUR NEGATIVE 10/23/2018 1659   Sepsis Labs: @LABRCNTIP (procalcitonin:4,lacticidven:4)  ) Recent Results (from the past 240 hour(s))  SARS Coronavirus 2 (Hosp order,Performed in [facility] lab via [employer] ID)     Status: None   Collection Time: 10/23/18  5:00 PM   Specimen: Dry Nasal Swab ([employer] ID Now)  Result Value Ref Range Status   SARS Coronavirus 2 (A[employer] ID Now) NEGATIVE NEGATIVE Final    Comment: (NOTE) SARS-CoV-2 target nucleic acids are NOT DETECTED. The SARS-CoV-2 RNA is generally detectable in upper and lower respiratory specimens during the acute phase of infection.  Negativeresults do not preclude SARS-CoV-2 infection, do not rule out coinfections with other pathogens, and should not be used as the  sole basis for treatment or other patient management decisions.  Negative results must be combined with clinical observations, patient history, and epidemiological information. The expected result is Negative. Fact Sheet for Patients: http://www.graves-ford.org/https://www.fda.gov/media/136524/download Fact Sheet for Healthcare Providers: EnviroConcern.sihttps://www.fda.gov/media/136523/download This test is not yet approved or cleared by the [place] FDA and  has been authorized for detection and/or diagnosis of SARS-CoV-2 by FDA under an Emergency Use Authorization (EUA).  This EUA will remain in effect (meaning this test can be used) for the duration of  the COVID19 declaration under Section 5 64(b)(1) of the Act, 21 U.S.C.  section  [phone] 3(b)(1), unless the authorization is terminated or revoked sooner. Performed at [facility] [place], [address] [place], [place] [address]   MRSA PCR Screening     Status: None   Collection Time: 10/23/18  9:29 PM   Specimen: Nasal Mucosa; Nasopharyngeal  Result Value Ref Range Status   MRSA by PCR NEGATIVE NEGATIVE Final    Comment:        The GeneXpert MRSA Assay (FDA approved for NASAL specimens only), is one component of a comprehensive MRSA colonization surveillance program. It is not intended to diagnose MRSA infection nor to guide or monitor treatment for MRSA infections. Performed at [facility], [address] [place], [place] [address]   Culture, respiratory (tracheal aspirate)     Status: None   Collection Time: 10/23/18 11:27 PM   Specimen: Tracheal Aspirate; Respiratory  Result Value Ref Range Status   Specimen Description TRACHEAL ASPIRATE  Final   Special Requests NONE  Final   Gram Stain   Final    FEW WBC PRESENT, PREDOMINANTLY PMN FEW GRAM NEGATIVE RODS RARE GRAM POSITIVE COCCI    Culture   Final    Consistent with normal respiratory flora. Performed at [facility], [address] [place], [place] [address]    Report Status 10/26/2018 FINAL  Final      Studies: No results found.  Scheduled Meds: . heparin  5,000 Units Subcutaneous Q8H  . insulin aspart  0-5 Units Subcutaneous QHS  . insulin aspart  0-9 Units Subcutaneous TID WC  . ipratropium-albuterol  3 mL Nebulization TID  . levothyroxine  75 mcg Oral Q0600  . metoprolol succinate  50 mg Oral Daily  . Ensure Max Protein  11 oz Oral BID    Continuous Infusions: . sodium chloride 10 mL/hr at 10/24/18 1100     LOS: 3 days     Darlin Droparole N Assunta Pupo, MD [facility] Pager [phone]  If 7PM-7AM, please contact night-coverage [email or web address] Password [email or web address] 10/26/2018, 1:03 PM

## 2018-10-26 NOTE — Progress Notes (Signed)
Assumed care of this patient on cpap, pt A&ox4, VSS. Pt states that he is not in any pain. Pt has no complaints or concerns at this time and will call if he needs anything. Pt asks if we can let him sleep tonight, and states that he is really looking forward to going home tomorrow. Noted that pt Renal labs are trending up. Will mention to next shift. Monitoring.

## 2018-10-26 NOTE — Progress Notes (Signed)
Progress Note  Patient Name: Kyle Harrison Date of Encounter: 10/26/2018  Primary Cardiologist:   No primary care provider on file.   Subjective   Feeling OK.  Tried to use CPAP last night.  No acute pain.   Inpatient Medications    Scheduled Meds: . heparin  5,000 Units Subcutaneous Q8H  . insulin aspart  0-5 Units Subcutaneous QHS  . insulin aspart  0-9 Units Subcutaneous TID WC  . ipratropium-albuterol  3 mL Nebulization TID  . levothyroxine  75 mcg Oral Q0600  . metoprolol succinate  50 mg Oral Daily  . Ensure Max Protein  11 oz Oral BID   Continuous Infusions: . sodium chloride 10 mL/hr at 10/24/18 1100   PRN Meds: acetaminophen, hydrALAZINE, ibuprofen, ondansetron (ZOFRAN) IV   Vital Signs    Vitals:   10/25/18 2001 10/26/18 0600 10/26/18 0743 10/26/18 0745  BP:  (!) 122/102 131/82   Pulse:   80   Resp:   18   Temp:  98.6 F (37 C) 97.8 F (36.6 C)   TempSrc:  Oral    SpO2: (S) (!) 87% (!) 88% 90% 95%  Weight:      Height:        Intake/Output Summary (Last 24 hours) at 10/26/2018 0919 Last data filed at 10/26/2018 0846 Gross per 24 hour  Intake 240 ml  Output 850 ml  Net -610 ml   Filed Weights   10/23/18 1657 10/24/18 0413 10/25/18 0500  Weight: (!) 167.8 kg (!) 163.4 kg (!) 161.2 kg    Telemetry    NSR with run of wide complex possible NSVT - Personally Reviewed  ECG    NA - Personally Reviewed  Physical Exam   GEN: No acute distress.   Neck: No  JVD Cardiac: RRR, no murmurs, rubs, or gallops.  Respiratory: Clear  to auscultation bilaterally. GI: Soft, nontender, non-distended  MS: Left leg edema and chronic venous stasis changes. No deformity. Neuro:  Nonfocal  Psych: Normal affect   Labs    Chemistry Recent Labs  Lab 10/23/18 1659  10/23/18 2348 10/24/18 0339 10/24/18 0440 10/24/18 2016 10/25/18 0308  NA 139   < > 139 139 137 138 139  K 3.6   < > 4.1 3.9 3.2* 3.6 4.2  CL 96*  --  95* 96*  --  96* 96*  CO2 35*  --   34* 32  --  34* 36*  GLUCOSE 151*  --  92 92  --  94 112*  BUN 16  --  14 13  --  14 17  CREATININE 1.73*  --  1.90* 1.94*  --  2.21* 2.40*  CALCIUM 8.8*  --  8.9 9.0  --  8.9 9.1  PROT 7.5  --   --   --   --   --   --   ALBUMIN 3.6  --  3.4*  --   --   --   --   AST 41  --   --   --   --   --   --   ALT 17  --   --   --   --   --   --   ALKPHOS 43  --   --   --   --   --   --   BILITOT 1.0  --   --   --   --   --   --   GFRNONAA 49*  --  44* 43*  --  36* 33*  GFRAA 57*  --  51* 49*  --  42* 38*  ANIONGAP 8  --  10 11  --  8 7   < > = values in this interval not displayed.     Hematology Recent Labs  Lab 10/23/18 1659  10/23/18 2348 10/24/18 0339 10/24/18 0440  WBC 6.8  --  5.8 8.1  --   RBC 4.50  --  4.59 4.60  --   HGB 12.9*   < > 13.3 13.3 14.6  HCT 42.4   < > 42.0 42.3 43.0  MCV 94.2  --  91.5 92.0  --   MCH 28.7  --  29.0 28.9  --   MCHC 30.4  --  31.7 31.4  --   RDW 15.7*  --  15.7* 15.5  --   PLT 163  --  167 147*  --    < > = values in this interval not displayed.    Cardiac Enzymes Recent Labs  Lab 10/23/18 1659 10/23/18 2348  TROPONINI 0.04* 0.03*   No results for input(s): TROPIPOC in the last 168 hours.   BNP Recent Labs  Lab 10/23/18 1659  BNP 17.6     DDimer No results for input(s): DDIMER in the last 168 hours.   Radiology    [place] Lower Extremity Venous (dvt)  Result Date: 10/25/2018  Lower Venous Study Indications: Swelling, and SOB.  Limitations: Body habitus and poor ultrasound/tissue interface. Comparison Study: no prior Performing Technologist: Blanch MediaMegan Riddle RVS  Examination Guidelines: A complete evaluation includes B-mode imaging, spectral Doppler, color Doppler, and power Doppler as needed of all accessible portions of each vessel. Bilateral testing is considered an integral part of a complete examination. Limited examinations for reoccurring indications may be performed as noted.   +---------+---------------+---------+-----------+----------+--------------+ RIGHT    CompressibilityPhasicitySpontaneityPropertiesSummary        +---------+---------------+---------+-----------+----------+--------------+ CFV      Full           Yes      Yes                                 +---------+---------------+---------+-----------+----------+--------------+ SFJ      Full                                                        +---------+---------------+---------+-----------+----------+--------------+ FV Prox  Full                                                        +---------+---------------+---------+-----------+----------+--------------+ FV Mid   Full                                                        +---------+---------------+---------+-----------+----------+--------------+ FV DistalFull                                                        +---------+---------------+---------+-----------+----------+--------------+  PFV      Full                                                        +---------+---------------+---------+-----------+----------+--------------+ POP      Full           Yes      Yes                                 +---------+---------------+---------+-----------+----------+--------------+ PTV                                                   Not visualized +---------+---------------+---------+-----------+----------+--------------+ PERO                                                  Not visualized +---------+---------------+---------+-----------+----------+--------------+   +---------+---------------+---------+-----------+----------+--------------+ LEFT     CompressibilityPhasicitySpontaneityPropertiesSummary        +---------+---------------+---------+-----------+----------+--------------+ CFV      Full           Yes      Yes                                  +---------+---------------+---------+-----------+----------+--------------+ SFJ      Full                                                        +---------+---------------+---------+-----------+----------+--------------+ FV Prox  Full                                                        +---------+---------------+---------+-----------+----------+--------------+ FV Mid   Full                                                        +---------+---------------+---------+-----------+----------+--------------+ FV DistalFull                                                        +---------+---------------+---------+-----------+----------+--------------+ PFV      Full                                                        +---------+---------------+---------+-----------+----------+--------------+  POP      Full           Yes      Yes                                 +---------+---------------+---------+-----------+----------+--------------+ PTV      Full                                                        +---------+---------------+---------+-----------+----------+--------------+ PERO                                                  Not visualized +---------+---------------+---------+-----------+----------+--------------+     Summary: Right: There is no evidence of deep vein thrombosis in the lower extremity. However, portions of this examination were limited- see technologist comments above. No cystic structure found in the popliteal fossa. Left: There is no evidence of deep vein thrombosis in the lower extremity. However, portions of this examination were limited- see technologist comments above. No cystic structure found in the popliteal fossa.  *See table(s) above for measurements and observations. Electronically signed by Waverly Ferrarihristopher Dickson MD on 10/25/2018 at 11:45:42 AM.    Final     Cardiac Studies    ECHO:   1. The left ventricle has low normal  systolic function, with an ejection fraction of 50-55%. The cavity size was normal. There is moderately increased left ventricular wall thickness. Left ventricular diastolic parameters were normal.  2. The mitral valve is grossly normal.  3. The tricuspid valve is grossly normal.  4. The aortic valve is tricuspid. No stenosis of the aortic valve.  5. Low normal to mildly reduced LV systolic function; moderate LVH; prominent apical trabeculae; would consider cardiac MR to R/O noncompaction.  Patient Profile     38 y.o. male with a hx of HTN, DM, OSA on Cpap, morbid obesity, diastolic HF and pulmonary HTN who is being seen for the evaluation of CHF at the request of Dr. Jacqulyn BathPahwani.  Assessment & Plan    ACUTE DIASTOLIC HF:   I/O are incomplete.   Weight is down at least 6 kg since admission.  There is likely a component of hypoventilation obesity syndrome and a significant aspect of his care will be lifestyle change as below.  I stopped his Lasix yesterday but he will need a maintenance dose higher than the low dose he had on admission.  Pending the creat results today I might suggest at least 40 mg bid with frequent follow up labs.    CKD WITH AKI:  Labs pending  OSA:   See below.    HTN:  Stopped Norvasc in favor of beta blocker.    BP OK and no evidence of significant bradycardia overnight.    MORBID OBESITY:    I talked to him about starting to do some of the cooking.  His brother with whom he lives works.  They can buy food and he could potentially prepare this.  I will consult nutrition.  He should be followed in our Advanced HF clinic and might be a good candidate for our Paramedicine program.  He will  clearly need home health assessments and should be established with with a scale, BP cuff, telehealth.  He needs his CPAP supplied.    For questions or updates, please contact [facility] Please consult [email or web address] for contact info under Cardiology/STEMI.   Signed, Rollene RotundaJames Sharica Roedel, MD   10/26/2018, 9:19 AM

## 2018-10-26 NOTE — Plan of Care (Signed)
Nutrition Education Note  RD consulted for nutrition education regarding CHF.  **[place] [employer] [place] on the [place]. Will mail handouts to patient's home address. Patient noted an error in his home address that is available in facesheet. [place] in [place]  RD will provide "Low Sodium Nutrition Therapy", "Sodium-free flavoring tips" and  "Heart Healthy Label Reading tips" handouts from the Academy of Nutrition and Dietetics. Reviewed patient's dietary recall. Provided examples on ways to decrease sodium intake in diet. Discouraged intake of processed foods and use of salt shaker. Encouraged fresh fruits and vegetables as well as whole grain sources of carbohydrates to maximize fiber intake.   RD discussed why it is important for patient to adhere to diet recommendations, and emphasized the role of fluids, foods to avoid, and importance of weighing self daily. Teach back method used.  Expect fair compliance.   Body mass index is 49.57 kg/m. Pt meets criteria for morbid obesity based on current BMI.  Current diet order is heart healthy, patient is consuming approximately 100% of meals at this time. Labs and medications reviewed. No further nutrition interventions warranted at this time.  If additional nutrition issues arise, please re-consult RD.   Kyle Bibles, MS, RD, [place] Dietitian Pager: [phone] After Hours Pager: [phone]

## 2018-10-26 NOTE — Social Work (Signed)
CSW had also received a consult for financial assistance, RNCM has already contacted financial counseling. CSW signing off. Please consult if any additional needs arise.   [place], [place] Work [phone]

## 2018-10-27 LAB — BASIC METABOLIC PANEL
Anion gap: 9 (ref 5–15)
BUN: 16 mg/dL (ref 6–20)
CO2: 35 mmol/L — ABNORMAL HIGH (ref 22–32)
Calcium: 9.4 mg/dL (ref 8.9–10.3)
Chloride: 94 mmol/L — ABNORMAL LOW (ref 98–111)
Creatinine, Ser: 1.88 mg/dL — ABNORMAL HIGH (ref 0.61–1.24)
GFR calc Af Amer: 51 mL/min — ABNORMAL LOW (ref >60)
GFR calc non Af Amer: 44 mL/min — ABNORMAL LOW (ref >60)
Glucose, Bld: 96 mg/dL (ref 70–99)
Potassium: 4.2 mmol/L (ref 3.5–5.1)
Sodium: 138 mmol/L (ref 135–145)

## 2018-10-27 LAB — GLUCOSE, CAPILLARY: Glucose-Capillary: 71 mg/dL (ref 70–99)

## 2018-10-27 MED ORDER — FUROSEMIDE 40 MG PO TABS
40.0000 mg | ORAL_TABLET | Freq: Two times a day (BID) | ORAL | Status: DC
  Administered 2018-10-27 – 2018-10-28 (×2): 40 mg via ORAL
  Filled 2018-10-27 (×2): qty 1

## 2018-10-27 MED ORDER — FLUTICASONE PROPIONATE 50 MCG/ACT NA SUSP
2.0000 | Freq: Every day | NASAL | Status: DC
  Administered 2018-10-27 – 2018-10-28 (×2): 2 via NASAL
  Filled 2018-10-27: qty 16

## 2018-10-27 NOTE — Progress Notes (Signed)
Physical Therapy Treatment Patient Details Name: Kyle Harrison MRN: [id number] DOB: [birth year] Today's Date: 10/27/2018    History of Present Illness 38 yo male with hx of COPD, HTN, DM, OSA on CPAP, morbid obesity, diastolic CHF, pulmonary HTN presenting to outside hospital with 2 weeks of worsening shortness of breath and lower extremity edema.     PT Comments    Pt admitted with above diagnosis. Pt currently with functional limitations due to the deficits listed below (see PT Problem List). Pt was able to walk on unit with and without the rollator.  Pt safer with rollator and reviewed how to safely use the rollator.  Pt wants the rollator for home.  Will benefit from HHPT safety eval.   Pt will benefit from skilled PT to increase their independence and safety with mobility to allow discharge to the venue listed below.     Follow Up Recommendations  Home health PT;Supervision/Assistance - 24 hour(safety eval)     Equipment Recommendations  (wide rollator)    Recommendations for Other Services       Precautions / Restrictions Precautions Precautions: Fall Restrictions Weight Bearing Restrictions: No    Mobility  Bed Mobility               General bed mobility comments: pt up in chair upon PT arrival  Transfers Overall transfer level: Needs assistance Equipment used: None Transfers: Sit to/from Stand Sit to Stand: Supervision            Ambulation/Gait Ambulation/Gait assistance: Min guard Gait Distance (Feet): 270 Feet Assistive device: None Gait Pattern/deviations: Step-through pattern;Decreased stride length;Wide base of support;Antalgic Gait velocity: decreased Gait velocity interpretation: 1.31 - 2.62 ft/sec, indicative of limited community ambulator General Gait Details: pt with L LE limp, pt did say it improved with ambulation. Pt reports that the walker helped relieve some of the pain in the L LE. Pt can walk  without RW but safer with UE support.  Discussed use of rollator and obtained one to show pt how to lock and unlock brakes and how to sit to rest.    Stairs             Wheelchair Mobility    Modified Rankin (Stroke Patients Only)       Balance Overall balance assessment: Mild deficits observed, not formally tested                                          Cognition Arousal/Alertness: Awake/alert Behavior During Therapy: WFL for tasks assessed/performed Overall Cognitive Status: Within Functional Limits for tasks assessed                                 General Comments: pt with delayed response however suspect this to be his baseline, pt with muffled speech as well      Exercises      General Comments        Pertinent Vitals/Pain Pain Assessment: No/denies pain    Home Living                      Prior Function            PT Goals (current goals can now be found in the care plan section) Acute Rehab PT Goals Patient Stated Goal: get better Progress towards  PT goals: Progressing toward goals    Frequency    Min 3X/week      PT Plan Current plan remains appropriate    Co-evaluation              AM-PAC PT "6 Clicks" Mobility   Outcome Measure  Help needed turning from your back to your side while in a flat bed without using bedrails?: None Help needed moving from lying on your back to sitting on the side of a flat bed without using bedrails?: None Help needed moving to and from a bed to a chair (including a wheelchair)?: A Little Help needed standing up from a chair using your arms (e.g., wheelchair or bedside chair)?: A Little Help needed to walk in hospital room?: A Little Help needed climbing 3-5 steps with a railing? : A Little 6 Click Score: 20    End of Session Equipment Utilized During Treatment: Gait belt Activity Tolerance: Patient tolerated treatment well Patient left: in chair;with call bell/phone within reach Nurse  Communication: Mobility status PT Visit Diagnosis: Unsteadiness on feet (R26.81);Difficulty in walking, not elsewhere classified (R26.2)     Time: [id number] PT Time Calculation (min) (ACUTE ONLY): 12 min  Charges:  $Gait Training: 8-22 mins                     [name],PT Acute Rehabilitation Services Pager:  [phone]  Office:  [phone]     Berline LopesDawn F Naz Denunzio 10/27/2018, 1:17 PM

## 2018-10-27 NOTE — Progress Notes (Signed)
Progress Note  Patient Name: Kyle Harrison Date of Encounter: 10/27/2018  Primary Cardiologist:   No primary care provider on file.   Subjective   No acute complaints overnight.  No chest pain or SOB.  He does have sinus congestion.   Inpatient Medications    Scheduled Meds: . heparin  5,000 Units Subcutaneous Q8H  . ipratropium-albuterol  3 mL Nebulization TID  . levothyroxine  75 mcg Oral Q0600  . metoprolol succinate  50 mg Oral Daily  . Ensure Max Protein  11 oz Oral BID   Continuous Infusions: . sodium chloride 10 mL/hr at 10/24/18 1100   PRN Meds: acetaminophen, hydrALAZINE, ibuprofen, ondansetron (ZOFRAN) IV   Vital Signs    Vitals:   10/26/18 2300 10/26/18 2349 10/27/18 0811 10/27/18 0814  BP:  (!) 146/95 124/88   Pulse: 81  79   Resp: 19  (!) 22   Temp:   98 F (36.7 C)   TempSrc:      SpO2: 96%  91% 93%  Weight:      Height:        Intake/Output Summary (Last 24 hours) at 10/27/2018 0900 Last data filed at 10/26/2018 2349 Gross per 24 hour  Intake -  Output 450 ml  Net -450 ml   Filed Weights   10/23/18 1657 10/24/18 0413 10/25/18 0500  Weight: (!) 167.8 kg (!) 163.4 kg (!) 161.2 kg    Telemetry    NSR - Personally Reviewed  ECG    NA - Personally Reviewed  Physical Exam   GEN: No  acute distress.   Neck: No  JVD Cardiac: RRR, no murmurs, rubs, or gallops.  Respiratory: Clear   to auscultation bilaterally. GI: Soft, nontender, non-distended, normal bowel sounds  MS:  No edema; No deformity. Neuro:   Nonfocal  Psych: Oriented and appropriate    Labs    Chemistry Recent Labs  Lab 10/23/18 1659  10/23/18 2348  10/25/18 0308 10/26/18 0935 10/27/18 0536  NA 139   < > 139   < > 139 138 138  K 3.6   < > 4.1   < > 4.2 3.7 4.2  CL 96*  --  95*   < > 96* 93* 94*  CO2 35*  --  34*   < > 36* 37* 35*  GLUCOSE 151*  --  92   < > 112* 162* 96  BUN 16  --  14   < > 17 15 16   CREATININE 1.73*  --  1.90*   < > 2.40* 1.92* 1.88*   CALCIUM 8.8*  --  8.9   < > 9.1 9.4 9.4  PROT 7.5  --   --   --   --   --   --   ALBUMIN 3.6  --  3.4*  --   --   --   --   AST 41  --   --   --   --   --   --   ALT 17  --   --   --   --   --   --   ALKPHOS 43  --   --   --   --   --   --   BILITOT 1.0  --   --   --   --   --   --   GFRNONAA 49*  --  44*   < > 33* 43* 44*  GFRAA 57*  --  51*   < > 38* 50* 51*  ANIONGAP 8  --  10   < > 7 8 9    < > = values in this interval not displayed.     Hematology Recent Labs  Lab 10/23/18 1659  10/23/18 2348 10/24/18 0339 10/24/18 0440  WBC 6.8  --  5.8 8.1  --   RBC 4.50  --  4.59 4.60  --   HGB 12.9*   < > 13.3 13.3 14.6  HCT 42.4   < > 42.0 42.3 43.0  MCV 94.2  --  91.5 92.0  --   MCH 28.7  --  29.0 28.9  --   MCHC 30.4  --  31.7 31.4  --   RDW 15.7*  --  15.7* 15.5  --   PLT 163  --  167 147*  --    < > = values in this interval not displayed.    Cardiac Enzymes Recent Labs  Lab 10/23/18 1659 10/23/18 2348  TROPONINI 0.04* 0.03*   No results for input(s): TROPIPOC in the last 168 hours.   BNP Recent Labs  Lab 10/23/18 1659  BNP 17.6     DDimer No results for input(s): DDIMER in the last 168 hours.   Radiology    No results found.  Cardiac Studies    ECHO:   1. The left ventricle has low normal systolic function, with an ejection fraction of 50-55%. The cavity size was normal. There is moderately increased left ventricular wall thickness. Left ventricular diastolic parameters were normal.  2. The mitral valve is grossly normal.  3. The tricuspid valve is grossly normal.  4. The aortic valve is tricuspid. No stenosis of the aortic valve.  5. Low normal to mildly reduced LV systolic function; moderate LVH; prominent apical trabeculae; would consider cardiac MR to R/O noncompaction.  Patient Profile     38 y.o. male with a hx of HTN, DM, OSA on Cpap, morbid obesity, diastolic HF and pulmonary HTN who is being seen for the evaluation of CHF at the request of Dr.  Jacqulyn BathPahwani.  Assessment & Plan    ACUTE DIASTOLIC HF:   I/O are incomplete.   Weight is down at least 6.5 kg since admission.  There is likely a component of hypoventilation obesity syndrome.  Creat is up but stable.  I would discharge on 40 mg bid of Lasix.  Needs follow up in HF clinic in large part for the social aspect.  Needs labs next week.  We will arrange follow up.   CKD WITH AKI:  Creat elevated but stable.  Follow up in the clinic.   OSA:   Needs to have CPAP supplied as an out patient.  I am not sure how we can do this.    HTN:  OK to continue meds as on MAR  MORBID OBESITY:    Nutrition consult completed.  Needs weight loss with diet and increased activity.      For questions or updates, please contact [facility] Please consult [email or web address] for contact info under Cardiology/STEMI.   Signed, Rollene RotundaJames Afsheen Antony, MD  10/27/2018, 9:00 AM

## 2018-10-27 NOTE — Progress Notes (Signed)
PROGRESS NOTE  Kyle Harrison ZOX:[id number] DOB: 17-Oct-1980 DOA: 10/23/2018 PCP: Patient, No Pcp PerPilar Plate  HPI/Recap of past 24 hours: 38 yo male with hx of HTN, DM, OSA on CPAP, morbid obesity, diastolic CHF, pulmonary HTN presented to outside hospital with 2 weeks of worsening shortness of breath and lower extremity edema. Was hypoxic on arrival to OSH , placed on BIPAP and CO2 noted to be over 90, patient was altered and was intubated, given lasix and [facility]M in ICU.  He was thought to be having acute on chronic congestive heart failure, diastolic.  Patient was eventually extubated on 10/24/2018 and was transferred under tried hospitalist service starting 10/25/2018. [id number] test was negative.  10/27/18: Patient seen and examined at his bedside.  Reports feeling congested.  Started on Flonase.  Concern about going home without CPAP.  Overnight pulse oximetry ordered.  Lasix as recommended by cardiology 40 mg twice daily.  Assessment/Plan: Principal Problem:   Acute on chronic respiratory failure with hypoxia and hypercapnia (HCC) Active Problems:   Respiratory failure (HCC)   Morbid (severe) obesity due to excess calories (HCC)   Acute on chronic diastolic CHF (congestive heart failure) (HCC)   Hypertension associated with diabetes (HCC)   CKD (chronic kidney disease) stage 3, GFR 30-59 ml/min (HCC)  Acute hypoxic hypercarbic respiratory failure likely multifactorial secondary to acute diastolic CHF Intubated on 10/23/2018 Extubated on 10/24/2018 and [facility] Cardiology has been consulted and following Net I&O since admission Continue to monitor urine output Restart Lasix p.o. 40 mg twice daily  Chest and nasal congestion Start Flonase Continue nebulizers DuoNeb 3 times daily  AKI on possible CKD 3 Presented with creatinine of 1.73 with GFR 49 Creatinine improving 1.88 from 2.40 yesterday Continue to avoid  nephrotoxins Resume Lasix  Acute diastolic CHF Last 2D echo done on 10/24/2018 showed normal LVEF Cardiac medications per cardiology  OSA, not on CPAP at home Obtain overnight oximetry  Hypertension Pressure is normotensive Started on beta-blocker by cardiology Norvasc Dced per cardiology  Morbid obesity BMI 49 Recommend weight loss outpatient with healthy dieting and regular physical activity  Resolved acute encephalopathy likely secondary to CO2 narcosis  History of Graves' disease with hypothyroidism: Continue Synthroid.  Prediabetes: Not on any medications prior to hospitalization.  Current hemoglobin A1c 6.2.  Will start him on SSI.  .  DVT prophylaxis: Heparin subcu 3 times daily Code Status: Full code Family Communication:  Will call if okay with the patient Disposition Plan:  Possible discharge to home tomorrow 10/28/2018 after overnight pulse oximetry for possible qualification for CPAP.  Objective: Vitals:   10/26/18 2300 10/26/18 2349 10/27/18 0811 10/27/18 0814  BP:  (!) 146/95 124/88   Pulse: 81  79   Resp: 19  (!) 22   Temp:   98 F (36.7 C)   TempSrc:      SpO2: 96%  91% 93%  Weight:      Height:        Intake/Output Summary (Last 24 hours) at 10/27/2018 1338 Last data filed at 10/26/2018 2349 Gross per 24 hour  Intake --  Output 450 ml  Net -450 ml   Filed Weights   10/23/18 1657 10/24/18 0413 10/25/18 0500  Weight: (!) 167.8 kg (!) 163.4 kg (!) 161.2 kg    Exam:   General: 38 y.o. year-old male morbid obesity in no acute distress.  Alert oriented x3.   Cardiovascular: Regular rate and  rhythm with no rubs or gallops.  No JVD or thyromegaly  Respiratory: Mild rales at bases with no wheezes.  Abdomen: Obese nontender nondistended.  Normal bowel sounds.  Musculoskeletal: Bilateral lower extremity edema.  Skin: Hyperpigmentation possibly from chronic venous stasis.  Psychiatry: Mood is appropriate for condition and setting   Data  Reviewed: CBC: Recent Labs  Lab 10/23/18 1659 10/23/18 1935 10/23/18 2254 10/23/18 2348 10/24/18 0339 10/24/18 0440  WBC 6.8  --   --  5.8 8.1  --   NEUTROABS 4.5  --   --   --   --   --   HGB 12.9* 15.0 14.3 13.3 13.3 14.6  HCT 42.4 44.0 42.0 42.0 42.3 43.0  MCV 94.2  --   --  91.5 92.0  --   PLT 163  --   --  167 147*  --    Basic Metabolic Panel: Recent Labs  Lab 10/23/18 2348 10/24/18 0339 10/24/18 0440 10/24/18 2016 10/25/18 0308 10/26/18 0935 10/27/18 0536  NA 139 139 137 138 139 138 138  K 4.1 3.9 3.2* 3.6 4.2 3.7 4.2  CL 95* 96*  --  96* 96* 93* 94*  CO2 34* 32  --  34* 36* 37* 35*  GLUCOSE 92 92  --  94 112* 162* 96  BUN 14 13  --  14 17 15 16   CREATININE 1.90* 1.94*  --  2.21* 2.40* 1.92* 1.88*  CALCIUM 8.9 9.0  --  8.9 9.1 9.4 9.4  MG  --  2.1  --   --   --   --   --   PHOS 2.6 2.7  --   --   --   --   --    GFR: Estimated Creatinine Clearance: 82.7 mL/min (A) (by C-G formula based on SCr of 1.88 mg/dL (H)). Liver Function Tests: Recent Labs  Lab 10/23/18 1659 10/23/18 2348  AST 41  --   ALT 17  --   ALKPHOS 43  --   BILITOT 1.0  --   PROT 7.5  --   ALBUMIN 3.6 3.4*   No results for input(s): LIPASE, AMYLASE in the last 168 hours. No results for input(s): AMMONIA in the last 168 hours. Coagulation Profile: No results for input(s): INR, PROTIME in the last 168 hours. Cardiac Enzymes: Recent Labs  Lab 10/23/18 1659 10/23/18 2348  CKTOTAL  --  1,738*  TROPONINI 0.04* 0.03*   BNP (last 3 results) No results for input(s): PROBNP in the last 8760 hours. HbA1C: Recent Labs    10/25/18 0308  HGBA1C 6.2*   CBG: Recent Labs  Lab 10/26/18 1150 10/26/18 1213 10/26/18 1302 10/26/18 2147 10/27/18 0808  GLUCAP 60* 64* 102* 114* 71   Lipid Profile: No results for input(s): CHOL, HDL, LDLCALC, TRIG, CHOLHDL, LDLDIRECT in the last 72 hours. Thyroid Function Tests: No results for input(s): TSH, T4TOTAL, FREET4, T3FREE, THYROIDAB in the last  72 hours. Anemia Panel: No results for input(s): VITAMINB12, FOLATE, FERRITIN, TIBC, IRON, RETICCTPCT in the last 72 hours. Urine analysis:    Component Value Date/Time   COLORURINE YELLOW 10/23/2018 1659   APPEARANCEUR CLEAR 10/23/2018 1659   LABSPEC 1.025 10/23/2018 1659   PHURINE 6.5 10/23/2018 1659   GLUCOSEU NEGATIVE 10/23/2018 1659   HGBUR TRACE (A) 10/23/2018 1659   BILIRUBINUR NEGATIVE 10/23/2018 1659   KETONESUR NEGATIVE 10/23/2018 1659   PROTEINUR >300 (A) 10/23/2018 1659   NITRITE NEGATIVE 10/23/2018 1659   LEUKOCYTESUR NEGATIVE 10/23/2018 1659  Sepsis Labs: @LABRCNTIP (procalcitonin:4,lacticidven:4)  ) Recent Results (from the past 240 hour(s))  SARS Coronavirus 2 (Hosp order,Performed in [facility] lab via [employer] ID)     Status: None   Collection Time: 10/23/18  5:00 PM   Specimen: Dry Nasal Swab ([employer] ID Now)  Result Value Ref Range Status   SARS Coronavirus 2 ([employer] ID Now) NEGATIVE NEGATIVE Final    Comment: (NOTE) SARS-CoV-2 target nucleic acids are NOT DETECTED. The SARS-CoV-2 RNA is generally detectable in upper and lower respiratory specimens during the acute phase of infection.  Negativeresults do not preclude SARS-CoV-2 infection, do not rule out coinfections with other pathogens, and should not be used as the  sole basis for treatment or other patient management decisions.  Negative results must be combined with clinical observations, patient history, and epidemiological information. The expected result is Negative. Fact Sheet for Patients: GolfingFamily.no Fact Sheet for Healthcare Providers: https://www.hernandez-brewer.com/ This test is not yet approved or cleared by the [place] FDA and  has been authorized for detection and/or diagnosis of SARS-CoV-2 by FDA under an Emergency Use Authorization (EUA).  This EUA will remain in effect (meaning this test can be used) for the duration of  the COVID19  declaration under Section 5 64(b)(1) of the Act, 21 U.S.C.  section 360 674 2994 3(b)(1), unless the authorization is terminated or revoked sooner. Performed at [facility], [address] [place], [place] [address]   MRSA PCR Screening     Status: None   Collection Time: 10/23/18  9:29 PM   Specimen: Nasal Mucosa; Nasopharyngeal  Result Value Ref Range Status   MRSA by PCR NEGATIVE NEGATIVE Final    Comment:        The GeneXpert MRSA Assay (FDA approved for NASAL specimens only), is one component of a comprehensive MRSA colonization surveillance program. It is not intended to diagnose MRSA infection nor to guide or monitor treatment for MRSA infections. Performed at [facility], [place] [address], [place], [place] [address]   Culture, respiratory (tracheal aspirate)     Status: None   Collection Time: 10/23/18 11:27 PM   Specimen: Tracheal Aspirate; Respiratory  Result Value Ref Range Status   Specimen Description TRACHEAL ASPIRATE  Final   Special Requests NONE  Final   Gram Stain   Final    FEW WBC PRESENT, PREDOMINANTLY PMN FEW GRAM NEGATIVE RODS RARE GRAM POSITIVE COCCI    Culture   Final    Consistent with normal respiratory flora. Performed at [facility], [place] [address], [place], [place] [address]    Report Status 10/26/2018 FINAL  Final      Studies: No results found.  Scheduled Meds:  fluticasone  2 spray Each Nare Daily   heparin  5,000 Units Subcutaneous Q8H   ipratropium-albuterol  3 mL Nebulization TID   levothyroxine  75 mcg Oral Q0600   metoprolol succinate  50 mg Oral Daily   Ensure Max Protein  11 oz Oral BID    Continuous Infusions:  sodium chloride 10 mL/hr at 10/24/18 1100     LOS: 4 days     Kayleen Memos, MD [facility] Pager [phone]  If 7PM-7AM, please contact night-coverage [email or web address] Password [facility] 10/27/2018, 1:38 PM

## 2018-10-28 LAB — GLUCOSE, CAPILLARY: Glucose-Capillary: 86 mg/dL (ref 70–99)

## 2018-10-28 MED ORDER — FUROSEMIDE 40 MG PO TABS: 40.0000 mg | ORAL_TABLET | Freq: Two times a day (BID) | ORAL | 0 refills | Status: DC

## 2018-10-28 MED ORDER — LEVOTHYROXINE SODIUM 75 MCG PO TABS: 75.0000 ug | ORAL_TABLET | Freq: Every day | ORAL | 0 refills | Status: DC

## 2018-10-28 MED ORDER — FLUTICASONE PROPIONATE 50 MCG/ACT NA SUSP: 2.0000 | Freq: Every day | NASAL | 0 refills | Status: DC

## 2018-10-28 MED ORDER — POTASSIUM CHLORIDE CRYS ER 10 MEQ PO TBCR: 10.0000 meq | EXTENDED_RELEASE_TABLET | Freq: Every day | ORAL | 0 refills | Status: DC

## 2018-10-28 MED ORDER — METOPROLOL SUCCINATE ER 50 MG PO TB24: 50.0000 mg | ORAL_TABLET | Freq: Every day | ORAL | 0 refills | Status: DC

## 2018-10-28 MED FILL — METOPROLOL SUCCINATE ER 50: 50 | 30 days supply | Qty: 30 | Fill #0

## 2018-10-28 MED FILL — FUROSEMIDE 40 MG TABLET: 40 | 30 days supply | Qty: 60 | Fill #0

## 2018-10-28 MED FILL — FLUTICASONE PROP 50 MCG SPR: 50 | 30 days supply | Qty: 16 | Fill #0

## 2018-10-28 MED FILL — POTASSIUM CHL ER M10 TABLET: 10 | 30 days supply | Qty: 30 | Fill #0

## 2018-10-28 MED FILL — LEVOTHYROXINE 75 MCG TABLET: 75 | 30 days supply | Qty: 30 | Fill #0

## 2018-10-28 NOTE — Discharge Instructions (Signed)
Acute Respiratory Failure, Adult ° °Acute respiratory failure occurs when there is not enough oxygen passing from your lungs to your body. When this happens, your lungs have trouble removing carbon dioxide from the blood. This causes your blood oxygen level to drop too low as carbon dioxide builds up. °Acute respiratory failure is a medical emergency. It can develop quickly, but it is temporary if treated promptly. Your lung capacity, or how much air your lungs can hold, may improve with time, exercise, and treatment. °What are the causes? °There are many possible causes of acute respiratory failure, including: °· Lung injury. °· Chest injury or damage to the ribs or tissues near the lungs. °· Lung conditions that affect the flow of air and blood into and out of the lungs, such as pneumonia, acute respiratory distress syndrome, and cystic fibrosis. °· Medical conditions, such as strokes or spinal cord injuries, that affect the muscles and nerves that control breathing. °· Blood infection (sepsis). °· Inflammation of the pancreas (pancreatitis). °· A blood clot in the lungs (pulmonary embolism). °· A large-volume blood transfusion. °· Burns. °· Near-drowning. °· Seizure. °· Smoke inhalation. °· Reaction to medicines. °· Alcohol or drug overdose. °What increases the risk? °This condition is more likely to develop in people who have: °· A blocked airway. °· Asthma. °· A condition or disease that damages or weakens the muscles, nerves, bones, or tissues that are involved in breathing. °· A serious infection. °· A health problem that blocks the unconscious reflex that is involved in breathing, such as hypothyroidism or sleep apnea. °· A lung injury or trauma. °What are the signs or symptoms? °Trouble breathing is the main symptom of acute respiratory failure. Symptoms may also include: °· Rapid breathing. °· Restlessness or anxiety. °· Skin, lips, or fingernails that appear blue (cyanosis). °· Rapid heart  rate. °· Abnormal heart rhythms (arrhythmias). °· Confusion or changes in behavior. °· Tiredness or loss of energy. °· Feeling sleepy or having a loss of consciousness. °How is this diagnosed? °Your health care provider can diagnose acute respiratory failure with a medical history and physical exam. During the exam, your health care provider will listen to your heart and check for crackling or wheezing sounds in your lungs. Your may also have tests to confirm the diagnosis and determine what is causing respiratory failure. These tests may include: °· Measuring the amount of oxygen in your blood (pulse oximetry). The measurement comes from a small device that is placed on your finger, earlobe, or toe. °· Other blood tests to measure blood gases and to look for signs of infection. °· Sampling your cerebral spinal fluid or tracheal fluid to check for infections. °· Chest X-[place] to look for fluid in spaces that should be filled with air. °· Electrocardiogram (ECG) to look at the heart's electrical activity. °How is this treated? °Treatment for this condition usually takes places in a hospital intensive care unit (ICU). Treatment depends on what is causing the condition. It may include one or more treatments until your symptoms improve. Treatment may include: °· Supplemental oxygen. Extra oxygen is given through a tube in the nose, a face mask, or a hood. °· A device such as a continuous positive airway pressure (CPAP) or bi-level positive airway pressure (BiPAP or BPAP) machine. This treatment uses mild air pressure to keep the airways open. A mask or other device will be placed over your nose or mouth. A tube that is connected to a motor will deliver oxygen through the   mask.  Ventilator. This treatment helps move air into and out of the lungs. This may be done with a bag and mask or a machine. For this treatment, a tube is placed in your windpipe (trachea) so air and oxygen can flow to the lungs.  Extracorporeal  membrane oxygenation (ECMO). This treatment temporarily takes over the function of the heart and lungs, supplying oxygen and removing carbon dioxide. ECMO gives the lungs a chance to recover. It may be used if a ventilator is not effective.  Tracheostomy. This is a procedure that creates a hole in the neck to insert a breathing tube.  Receiving fluids and medicines.  Rocking the bed to help breathing. Follow these instructions at home:  Take over-the-counter and prescription medicines only as told by your health care provider.  Return to normal activities as told by your health care provider. Ask your health care provider what activities are safe for you.  Keep all follow-up visits as told by your health care provider. This is important. How is this prevented? Treating infections and medical conditions that may lead to acute respiratory failure can help prevent the condition from developing. Contact a health care provider if:  You have a fever.  Your symptoms do not improve or they get worse. Get help right away if:  You are having trouble breathing.  You lose consciousness.  Your have cyanosis or turn blue.  You develop a rapid heart rate.  You are confused. These symptoms may represent a serious problem that is an emergency. Do not wait to see if the symptoms will go away. Get medical help right away. Call your local emergency services (911 in the [place] Do not drive yourself to the hospital. This information is not intended to replace advice given to you by your health care provider. Make sure you discuss any questions you have with your health care provider. Document Released: 04/25/2013 Document Revised: 11/16/2015 Document Reviewed: 11/06/2015 Elsevier Interactive Patient Education  2019 [employer] Heart fa Heart Failure Exacerbation  Heart failure is a condition in which the heart does not fill up with enough blood, and therefore does not pump enough blood and oxygen  to the body. When this happens, parts of the body do not get the blood and oxygen they need to function properly. This can cause symptoms such as breathing problems, fatigue, swelling, and confusion. Heart failure exacerbation refers to heart failure symptoms that get worse. The symptoms may get worse suddenly or develop slowly over time. Heart failure exacerbation is a serious medical problem that should be treated right away. What are the causes? A heart failure exacerbation can be triggered by:  Not taking your heart failure medicines correctly.  Infections.  Eating an unhealthy diet or a diet that is high in salt (sodium).  Drinking too much fluid.  Drinking alcohol.  Taking illegal drugs, such as cocaine or methamphetamine.  Not exercising. Other causes include:  Other heart conditions such as an irregular heartbeat (arrhythmia).  Anemia.  Other medical problems, such as kidney failure. Sometimes the cause of the exacerbation is not known. What are the signs or symptoms? When heart failure symptoms suddenly or slowly get worse, this may be a sign of heart failure exacerbation. Symptoms of heart failure include:  Breathing problems or shortness of breath.  Chronic coughing or wheezing.  Fatigue.  Nausea or lack of appetite.  Feeling light-headed.  Confusion or memory loss.  Increased heart rate or irregular heartbeat.  Buildup of fluid in  the legs, ankles, feet, or abdomen.  Difficulty breathing when lying down. How is this diagnosed? This condition is diagnosed based on:  Your symptoms and medical history.  A physical exam. You may also have tests, including:  Electrocardiogram (ECG). This test measures the electrical activity of your heart.  Echocardiogram. This test uses sound waves to take a picture of your heart to see how well it works.  Blood tests.  Imaging tests, such as: ? Chest X-ray. ? MRI. ? Ultrasound.  Stress test. This test examines  how well your heart functions when you exercise. Your heart is monitored while you exercise on a treadmill or exercise bike. If you cannot exercise, medicines may be used to increase your heartbeat in place of exercise.  Cardiac catheterization. During this test, a thin, flexible tube (catheter) is inserted into a blood vessel and threaded up to your heart. This test allows your health care provider to check the arteries that lead to your heart (coronary arteries).  Right heart catheterization. During this test, the pressure in your heart is measured. How is this treated? This condition may be treated by:  Adjusting your heart medicines.  Maintaining a healthy lifestyle. This includes: ? Eating a heart-healthy diet that is low in sodium. ? Not using any products that contain nicotine or tobacco, such as cigarettes and e-cigarettes. ? Regular exercise. ? Monitoring your fluid intake. ? Monitoring your weight and reporting changes to your health care provider.  Treating sleep apnea, if you have this condition.  Surgery. This may include: ? Implanting a device that helps both sides of your heart contract at the same time (cardiac resynchronization therapy device). This can help with heart function and relieve heart failure symptoms. ? Implanting a device that can correct heart rhythm problems (implantable cardioverter defibrillator). ? Connecting a device to your heart to help it pump blood (ventricular assist device). ? Heart transplant. Follow these instructions at home: Medicines  Take over-the-counter and prescription medicines only as told by your health care provider.  Do not stop taking your medicines or change the amount you take. If you are having problems or side effects from your medicines, talk to your health care provider.  If you are having difficulty paying for your medicines, contact a social worker or your clinic. There are many programs to assist with medicine  costs.  Talk to your health care provider before starting any new medicines or supplements.  Make sure your health care provider and pharmacist have a list of all the medicines you are taking. Eating and drinking   Avoid drinking alcohol.  Eat a heart-healthy diet as told by your health care provider. This includes: ? Plenty of fruits and vegetables. ? Lean proteins. ? Low-fat dairy. ? Whole grains. ? Foods that are low in sodium. Activity   Exercise regularly as told by your health care provider. Balance exercise with rest.  Ask your health care provider what activities are safe for you. This includes sexual activity, exercise, and daily tasks at home or work. Lifestyle  Do not use any products that contain nicotine or tobacco, such as cigarettes and e-cigarettes. If you need help quitting, ask your health care provider.  Maintain a healthy weight. Ask your health care provider what weight is healthy for you.  Consider joining a patient support group. This can help with emotional problems you may have, such as stress and anxiety. General instructions  Talk to your health care provider about flu and pneumonia vaccines.  Keep a list of medicines that you are taking. This may help in emergency situations.  Keep all follow-up visits as told by your health care provider. This is important. Contact a health care provider if:  You have questions about your medicines or you miss a dose.  You feel anxious, depressed, or stressed.  You have swelling in your feet, ankles, legs, or abdomen.  You have shortness of breath during activity or exercise.  You have a cough.  You have a fever.  You have trouble sleeping.  You gain 2-3 lb (1-1.4 kg) in 24 hours or 5 lb (2.3 kg) in a week. Get help right away if:  You have chest pain.  You have shortness of breath while resting.  You have severe fatigue.  You are confused.  You have severe dizziness.  You have a rapid or  irregular heartbeat.  You have nausea or you vomit.  You have a cough that is worse at night or you cannot lie flat.  You have a cough that will not go away.  You have severe depression or sadness. Summary  When heart failure symptoms get worse, it is called heart failure exacerbation.  Common causes of this condition include taking medicines incorrectly, infections, and drinking alcohol.  This condition may be treated by adjusting medicines, maintaining a healthy lifestyle, or surgery.  Do not stop taking your medicines or change the amount you take. If you are having problems or side effects from your medicines, talk to your health care provider. This information is not intended to replace advice given to you by your health care provider. Make sure you discuss any questions you have with your health care provider. Document Released: 09/01/2016 Document Revised: 09/01/2016 Document Reviewed: 09/01/2016 Elsevier Interactive Patient Education  2019 [place].  Heart Failure  Heart failure means your heart has trouble pumping blood. This makes it hard for your body to work well. Heart failure is usually a long-term (chronic) condition. You must take good care of yourself and follow your treatment plan from your doctor. Follow these instructions at home: Medicines  Take over-the-counter and prescription medicines only as told by your doctor. ? Do not stop taking your medicine unless your doctor told you to do that. ? Do not skip any doses. ? Refill your prescriptions before you run out of medicine. You need your medicines every day. Eating and drinking   Eat heart-healthy foods. Talk with a diet and nutrition specialist (dietitian) to make an eating plan.  Choose foods that: ? Have no trans fat. ? Are low in saturated fat and cholesterol.  Choose healthy foods, like: ? Fresh or frozen fruits and vegetables. ? Fish. ? Low-fat (lean) meats. ? Legumes (like beans, peas, and  lentils). ? Fat-free or low-fat dairy products. ? Whole-grain foods. ? High-fiber foods.  Limit salt (sodium) if told by your doctor. Ask your nutrition specialist to recommend heart-healthy seasonings.  Cook in healthy ways instead of frying. Healthy ways of cooking include: ? Roasting. ? Grilling. ? Broiling. ? Baking. ? Poaching. ? Steaming. ? Stir-frying.  Limit how much fluid you drink, if told by your doctor. Lifestyle  Do not smoke or use chewing tobacco. Do not use nicotine gum or patches before talking to your doctor.  Limit alcohol intake to no more than 1 drink a day for non-pregnant women and 2 drinks a day for men. One drink equals 12 oz of beer, 5 oz of wine, or 1 oz of hard  liquor. ? Tell your doctor if you drink alcohol many times a week. ? Talk with your doctor about whether any alcohol is safe for you. ? You should stop drinking alcohol: ? If your heart has been damaged by alcohol. ? You have very bad heart failure.  Do not use illegal drugs.  Lose weight if told by your doctor.  Do moderate physical activity if told by your doctor. Ask your doctor what activities are safe for you if: ? You are of older age (elderly). ? You have very bad heart failure. Keep track of important information  Weigh yourself every day. ? Weigh yourself every morning after you pee (urinate) and before breakfast. ? Wear the same amount of clothing each time. ? Write down your daily weight. Give your record to your doctor.  Check and write down your blood pressure as told by your doctor.  Check your pulse as told by your doctor. Dealing with heat and cold  If the weather is very hot: ? Avoid activity that takes a lot of energy. ? Use air conditioning or fans, or find a cooler place. ? Avoid caffeine. ? Avoid alcohol. ? Wear clothing that is loose-fitting, lightweight, and light-colored.  If the weather is very cold: ? Avoid activity that takes a lot of  energy. ? Layer your clothes. ? Wear mittens or gloves, a hat, and a scarf when you go outside. ? Avoid alcohol. General instructions  Manage other conditions that you have as told by your doctor.  Learn to manage stress. If you need help, ask your doctor.  Plan rest periods for when you get tired.  Get education and support as needed.  Get rehab (rehabilitation) to help you stay independent and to help with everyday tasks.  Stay up to date with shots (immunizations), especially pneumococcal and flu (influenza) shots.  Keep all follow-up visits as told by your doctor. This is important. Contact a doctor if:  You gain weight quickly.  You are more short of breath than normal.  You cannot do your normal activities.  You tire easily.  You cough more than normal, especially with activity.  You have any or more puffiness (swelling) in areas such as your hands, feet, ankles, or belly (abdomen).  You cannot sleep because it is hard to breathe.  You feel like your heart is beating fast (palpitations).  You get dizzy or light-headed when you stand up. Get help right away if:  You have trouble breathing.  You or someone else notices a change in your awareness. This could be trouble staying awake or trouble concentrating.  You have chest pain or discomfort.  You pass out (faint). Summary  Heart failure means your heart has trouble pumping blood.  Make sure you refill your prescriptions before you run out of medicine. You need your medicines every day.  Keep records of your weight and blood pressure to give to your doctor.  Contact a doctor if you gain weight quickly. This information is not intended to replace advice given to you by your health care provider. Make sure you discuss any questions you have with your health care provider. Document Released: 01/28/2008 Document Revised: 01/12/2018 Document Reviewed: 05/12/2016 Elsevier Interactive Patient Education  2019  [employer]  Heart Failure Eating Plan Heart failure, also called congestive heart failure, occurs when your heart does not pump blood well enough to meet your body's needs for oxygen-rich blood. Heart failure is a long-term (chronic) condition. Living with heart failure  can be challenging. However, following your health care provider's instructions about a healthy lifestyle and working with a diet and nutrition specialist (dietitian) to choose the right foods may help to improve your symptoms. What are tips for following this plan? General guidelines  Do not eat more than 2,300 mg of salt (sodium) a day. The amount of sodium that is recommended for you may be lower, depending on your condition.  Maintain a healthy body weight as directed. Ask your health care provider what a healthy weight is for you. ? Check your weight every day. ? Work with your health care provider and dietitian to make a plan that is right for you to lose weight or maintain your current weight.  Limit how much fluid you drink. Ask your health care provider or dietitian how much fluid you can have each day.  Limit or avoid alcohol as told by your health care provider or dietitian. Reading food labels  Check food labels for the amount of sodium per serving. Choose foods that have less than 140 mg (milligrams) of sodium in each serving.  Check food labels for the number of calories per serving. This is important if you need to limit your daily calorie intake to lose weight.  Check food labels for the serving size. If you eat more than one serving, you will be eating more sodium and calories than what is listed on the label.  Look for foods that are labeled as "sodium-free," "very low sodium," or "low sodium." ? Foods labeled as "reduced sodium" or "lightly salted" may still have more sodium than what is recommended for you. Cooking  Avoid adding salt when cooking. Ask your health care provider or dietitian before  using salt substitutes.  Season food with salt-free seasonings, spices, or herbs. Check the label of seasoning mixes to make sure they do not contain salt.  Cook with heart-healthy oils, such as olive, canola, soybean, or sunflower oil.  Do not fry foods. Cook foods using low-fat methods, such as baking, boiling, grilling, and broiling.  Limit unhealthy fats when cooking by: ? Removing the skin from poultry, such as chicken. ? Removing all visible fats from meats. ? Skimming the fat off from stews, soups, and gravies before serving them. Meal planning   Limit your intake of: ? Processed, canned, or pre-packaged foods. ? Foods that are high in trans fat, such as fried foods. ? Sweets, desserts, sugary drinks, and other foods with added sugar. ? Full-fat dairy products, such as whole milk.  Eat a balanced diet that includes: ? 4-5 servings of fruit each day and 4-5 servings of vegetables each day. At each meal, try to fill half of your plate with fruits and vegetables. ? Up to 6-8 servings of whole grains each day. ? Up to 2 servings of lean meat, poultry, or fish each day. One serving of meat is equal to 3 oz. This is about the same size as a deck of cards. ? 2 servings of low-fat dairy each day. ? Heart-healthy fats. Healthy fats called omega-3 fatty acids are found in foods such as flaxseed and cold-water fish like sardines, salmon, and mackerel.  Aim to eat 25-35 g (grams) of fiber a day. Foods that are high in fiber include apples, broccoli, carrots, beans, peas, and whole grains.  Do not add salt or condiments that contain salt (such as soy sauce) to foods before eating.  When eating at a restaurant, ask that your food be prepared with less  salt or no salt, if possible.  Try to eat 2 or more vegetarian meals each week.  Eat more home-cooked food and eat less restaurant, buffet, and fast food. Recommended foods The items listed may not be a complete list. Talk with your  dietitian about what dietary choices are best for you. Grains Bread with less than 80 mg of sodium per slice. Whole-wheat pasta, quinoa, and brown rice. Oats and oatmeal. Barley. Millet. Grits and cream of wheat. Whole-grain and whole-wheat cold cereal. Vegetables All fresh vegetables. Vegetables that are frozen without sauce or added salt. Low-sodium or sodium-free canned vegetables. Fruits All fresh, frozen, and canned fruits. Dried fruits, such as raisins, prunes, and cranberries. Meats and other protein foods Lean cuts of meat. Skinless chicken and [place]. Fish with high omega-3 fatty acids, such as salmon, sardines, and other cold-water fishes. Eggs. Dried beans, peas, and edamame. Unsalted nuts and nut butters. Dairy Low-fat or nonfat (skim) milk and dried milk. Rice milk, soy milk, and almond milk. Low-fat or nonfat yogurt. Small amounts of reduced-sodium block cheese. Low-sodium cottage cheese. Fats and oils Olive, canola, soybean, flaxseed, or sunflower oil. Avocado. Sweets and desserts Apple sauce. Granola bars. Sugar-free pudding and gelatin. Frozen fruit bars. Seasoning and other foods Fresh and dried herbs. Lemon or lime juice. Vinegar. Low-sodium ketchup. Salt-free marinades, salad dressings, sauces, and seasonings. Foods to avoid The items listed may not be a complete list. Talk with your dietitian about what dietary choices are best for you. Grains Bread with more than 80 mg of sodium per slice. Hot or cold cereal with more than 140 mg sodium per serving. Salted pretzels and crackers. Pre-packaged breadcrumbs. Bagels, croissants, and biscuits. Vegetables Canned vegetables. Frozen vegetables with sauce or seasonings. Creamed vegetables. [place] fries. Onion rings. Pickled vegetables and sauerkraut. Fruits Fruits that are dried with sodium-containing preservatives. Meats and other protein foods Ribs and chicken wings. Bacon, ham, pepperoni, bologna, salami, and packaged  luncheon meats. Hot dogs, bratwurst, and sausage. Canned meat. Smoked meat and fish. Salted nuts and seeds. Dairy Whole milk, half-and-half, and cream. Buttermilk. Processed cheese, cheese spreads, and cheese curds. Regular cottage cheese. Feta cheese. Shredded cheese. String cheese. Fats and oils Butter, lard, shortening, ghee, and bacon fat. Canned and packaged gravies. Seasoning and other foods Onion salt, garlic salt, table salt, and sea salt. Marinades. Regular salad dressings. Relishes, pickles, and olives. Meat flavorings and tenderizers, and bouillon cubes. Horseradish, ketchup, and mustard. Worcestershire sauce. Teriyaki sauce, soy sauce (including reduced sodium). Hot sauce and Tabasco sauce. Steak sauce, fish sauce, oyster sauce, and cocktail sauce. Taco seasonings. Barbecue sauce. Tartar sauce. Summary  A heart failure eating plan includes changes that limit your intake of sodium and unhealthy fat, and it may help you lose weight or maintain a healthy weight. Your health care provider may also recommend limiting how much fluid you drink.  Most people with heart failure should eat no more than 2,300 mg of salt (sodium) a day. The amount of sodium that is recommended for you may be lower, depending on your condition.  Contact your health care provider or dietitian before making any major changes to your diet. This information is not intended to replace advice given to you by your health care provider. Make sure you discuss any questions you have with your health care provider. Document Released: 09/04/2016 Document Revised: 09/04/2016 Document Reviewed: 09/04/2016 Elsevier Interactive Patient Education  2019 [employer].

## 2018-10-28 NOTE — Progress Notes (Signed)
SATURATION QUALIFICATIONS: (This note is used to comply with regulatory documentation for home oxygen)  Patient Saturations on Room Air at Rest 95%  Patient Saturations on Room Air while Ambulating 92%  Patient Saturations on 0 Liters of oxygen while Ambulating 92*%  Please briefly explain why patient needs home oxygen: Pt does not need home oxygen

## 2018-10-28 NOTE — TOC Transition Note (Addendum)
Transition of Care [facility]) - CM/SW Discharge Note   Patient Details  Name: Kyle Harrison MRN: [id number] Date of Birth: [birth year]  Transition of [facility]) CM/SW Contact:  Sharin Mons, RN Phone Number: 10/28/2018, 9:49 AM   Clinical Narrative:  Admitted with Acute on chronic respiratory failure with hypoxia and hypercapnia,  hx of HTN, DM, OSA on CPAP, morbid obesity, diastolic CHF, pulmonary HTN . Pt will transition to home today. States  Resides with brother. Referral made with [employer] for rolling walker with seat, pt without insurance, charity case. Gilford Rile will be delivered to pt's bedside prior to d/c. NCM made [employer] aware of pt needing home CPAP device checked on, ? Pt with tubing problems. Pt states active with [facility]. Pt to f/u with clinic to obtain hospital follow up appointment. NCM made pt aware and noted on AVS.  Please send Rx meds to [place]. Pt will probably need Match Letter to assist with med cost ... NCM to f/u.  Pt states has transportation to home.   [address] (Brother) [place] [phone]       10/28/2018 @ 1150 Match Letter enter to assist with med cost per NCM . Pt states can afford $ 3.00 copay/ Rx. TOC pharmacy to deliver Rx  meds to bedside prior to d/c.   Final next level of care: Home/Self Care Barriers to Discharge: No Barriers Identified   Patient Goals and CMS Choice        Discharge Placement                       Discharge Plan and Services                DME Arranged: Walker rolling(Charity case) DME Agency: [employer] Date DME Agency Contacted: 10/28/18(charity case) Time DME Agency Contacted: [phone] Representative spoke with at DME Agency: [place] (SDOH) Interventions     Readmission Risk Interventions No flowsheet data found.

## 2018-10-28 NOTE — Discharge Summary (Signed)
Discharge Summary  Kyle Harrison EAV:[id number] DOB: 1980-12-02  PCP: Patient, No Pcp Per  Admit date: 10/23/2018 Discharge date: 10/28/2018  Time spent: 35 minutes  Recommendations for Outpatient Follow-up:  1. Follow-up with cardiology posthospitalization 2. Follow-up with your primary care provider 3. Take your medications as prescribed 4. Abide by a heart healthy low-salt diet.  Discharge Diagnoses:  Active Hospital Problems   Diagnosis Date Noted   Acute on chronic respiratory failure with hypoxia and hypercapnia (HCC) 10/23/2018   Respiratory failure (HCC) 10/23/2018   Morbid (severe) obesity due to excess calories (HCC) 10/23/2018   Acute on chronic diastolic CHF (congestive heart failure) (HCC) 10/23/2018   Hypertension associated with diabetes (HCC) 10/23/2018   CKD (chronic kidney disease) stage 3, GFR 30-59 ml/min (HCC) 10/23/2018    Resolved Hospital Problems  No resolved problems to display.    Discharge Condition: Stable  Diet recommendation: Heart healthy low-salt diet.  Vitals:   10/28/18 0719 10/28/18 0803  BP: (!) 129/94   Pulse: 81   Resp: (!) 22   Temp: 98.7 F (37.1 C)   SpO2: 94% 96%    History of present illness:  38 yo male with hx of HTN, DM, OSA on CPAP, morbid obesity, diastolic CHF, pulmonary HTN presentedto outside hospital with 2 weeks of worsening shortness of breath and lower extremity edema. Was hypoxic on arrival to OSH , placed on BIPAP and CO2 noted to be over 90, patient was altered and was intubated, given lasix and [facility] under PCCM in ICU. He was thought to be having acute on chronic congestive heart failure, diastolic. Patient was eventually extubated on 10/24/2018 and was transferred under [facility] service starting 10/25/2018. [id number] test was negative.  Received IV Lasix with proper diuresing.  To date net I&O -6.7 L since admission.  Also on CPAP at night.  Case manager consulted  to assist with CPAP, made [employer] aware of patient needing home CPAP.  10/28/18: Patient seen and examined at his bedside.  States his breathing is improved on Flonase and diuretics.  Denies chest pain.  He has no new concerns.  On the day of discharge, the patient was hemodynamically stable.  He will need to follow-up with cardiology and his primary care provider posthospitalization.  He will also need to follow-up with [employer] for possible CPAP set-up.  Hospital Course:  Principal Problem:   Acute on chronic respiratory failure with hypoxia and hypercapnia (HCC) Active Problems:   Respiratory failure (HCC)   Morbid (severe) obesity due to excess calories (HCC)   Acute on chronic diastolic CHF (congestive heart failure) (HCC)   Hypertension associated with diabetes (HCC)   CKD (chronic kidney disease) stage 3, GFR 30-59 ml/min (HCC)  Resolved acute hypoxic hypercarbic respiratory failure likely multifactorial secondary to acute diastolic CHF Intubated on 10/23/2018 Extubated on 10/24/2018 and [facility] Cardiology consulted and followed Net I&O -6.7L since admission Continue p.o. Lasix 40 mg twice daily  Chest and nasal congestion Improving with Flonase   Improving AKI on possible CKD 3 Presented with creatinine of 1.73 with GFR 49 Creatinine improving 1.88 from 2.40  Continue to avoid nephrotoxins Follow-up with primary care provider outpatient.    Acute diastolic CHF Last 2D echo done on 10/24/2018 showed normal LVEF Continue cardiac medications as recommended by cardiology  On Lasix 40 mg twice daily and metoprolol succinate 50 mg daily   OSA [employer] contacted by case manager to assist with CPAP  set up. Follow-up with your primary care provider  Hypertension Blood pressure is normotensive Continue metoprolol succinate Continue to hold lisinopril due to AKI  Morbid obesity BMI 49 Recommend weight loss outpatient with healthy  dieting and regular physical activity  Resolved acute encephalopathy likely secondary to CO2 narcosis  History of Graves' disease with hypothyroidism:Continue Synthroid.  Prediabetes:Not on any medications prior to hospitalization. Current hemoglobin A1c 6.2. Will start him on SSI.  Marland Kitchen.   Code Status:Full code      Discharge Exam: BP (!) 129/94 (BP Location: Left Arm)    Pulse 81    Temp 98.7 F (37.1 C) (Oral)    Resp (!) 22    Ht 5\' 11"  (1.803 m)    Wt (!) 161.2 kg    SpO2 96%    BMI 49.57 kg/m   General: 38 y.o. year-old male morbidly obese in no acute distress.  Alert and oriented x3.  Cardiovascular: Regular rate and rhythm with no rubs or gallops.  No thyromegaly or JVD noted.    Respiratory: Clear to auscultation with no wheezes or rales. Good inspiratory effort.  Abdomen: Soft nontender nondistended with normal bowel sounds x4 quadrants.  Musculoskeletal: Trace lower extremity edema. 2/4 pulses in all 4 extremities.  Psychiatry: Mood is appropriate for condition and setting  Discharge Instructions You were cared for by a hospitalist during your hospital stay. If you have any questions about your discharge medications or the care you received while you were in the hospital after you are discharged, you can call the unit and asked to speak with the hospitalist on call if the hospitalist that took care of you is not available. Once you are discharged, your primary care physician will handle any further medical issues. Please note that NO REFILLS for any discharge medications will be authorized once you are discharged, as it is imperative that you return to your primary care physician (or establish a relationship with a primary care physician if you do not have one) for your aftercare needs so that they can reassess your need for medications and monitor your lab values.   Allergies as of 10/28/2018   No Known Allergies     Medication List    STOP taking these  medications   lisinopril 10 MG tablet Commonly known as: ZESTRIL     TAKE these medications   fluticasone 50 MCG/ACT nasal spray Commonly known as: FLONASE Place 2 sprays into both nostrils daily. Start taking on: October 29, 2018   furosemide 40 MG tablet Commonly known as: LASIX Take 1 tablet (40 mg total) by mouth 2 (two) times daily. What changed:   medication strength  how much to take  when to take this   levothyroxine 75 MCG tablet Commonly known as: SYNTHROID Take 1 tablet (75 mcg total) by mouth daily at 6 (six) AM. Start taking on: October 29, 2018 What changed:   how much to take  when to take this   metoprolol succinate 50 MG 24 hr tablet Commonly known as: TOPROL-XL Take 1 tablet (50 mg total) by mouth daily. Take with or immediately following a meal. Start taking on: October 29, 2018   potassium chloride 10 MEQ tablet Commonly known as: K-DUR Take 1 tablet (10 mEq total) by mouth daily. What changed:   how much to take  when to take this            Durable Medical Equipment  (From admission, onward)  Start     Ordered   10/27/18 1338  For home use only DME Walker  Once    Comments: Wide rollator  Question:  Patient needs a walker to treat with the following condition  Answer:  Ambulatory dysfunction   10/27/18 1338         No Known Allergies Follow-up Information    [facility]. Call in 1 day(s).   Why: Please call for a post hospital follow-up appointment. Contact information: [address] [place] [address] [phone]       [facility]. Call.   Why: Patient active with clinic. Please call and arrange post hospital follow up appointment Contact information: [address] [place], [place] [address]  [phone] Hours: Mon. -Thur, 8:30 am- 12n, 1:30 pm- 5pm       Rollene Rotunda, MD. Call in 1 day(s).   Specialty: Cardiology Why: Please call for a  post hospital follow-up appointment. Contact information: [address] [place] [address] [phone]            The results of significant diagnostics from this hospitalization (including imaging, microbiology, ancillary and laboratory) are listed below for reference.    Significant Diagnostic Studies: Dg Chest Port 1 View  Result Date: 10/24/2018 CLINICAL DATA:  Intubation. EXAM: PORTABLE CHEST 1 VIEW COMPARISON:  10/23/2018. FINDINGS: Endotracheal tube, NG tube in stable position. Stable cardiomegaly. Dense consolidation/infiltrate left lung base. This is worsened from prior exam. Mild infiltrate right lung base also noted on today's exam. Probable left pleural effusion. No pneumothorax. Degenerative change thoracic spine. IMPRESSION: 1.  Endotracheal tube and NG tube in stable position. 2.  Stable cardiomegaly. 3. Dense consolidation/infiltrate left lung base. This has worsened from prior exam. Left pleural effusion may also be present. Mild right base infiltrate also noted on today's exam. Electronically Signed   By: Maisie Fus  Register   On: 10/24/2018 07:04   Dg Chest Portable 1 View  Result Date: 10/23/2018 CLINICAL DATA:  Intubated EXAM: PORTABLE CHEST 1 VIEW COMPARISON:  10/23/2018, 06/13/2018 FINDINGS: Interval intubation, tip of the endotracheal tube is about 3.7 cm superior to the carina. Esophageal tube tip below the diaphragm but non included. Cardiomegaly with vascular congestion and bilateral interstitial and ground-glass opacity, suspect for mild edema. IMPRESSION: 1. Endotracheal tube tip about 3.7 cm superior to carina. Esophageal tube tip below the diaphragm but non included 2. Cardiomegaly with vascular congestion and mild diffuse bilateral interstitial and ground-glass opacities suspect for edema Electronically Signed   By: Jasmine Pang M.D.   On: 10/23/2018 19:17   Dg Chest Portable 1 View  Result Date: 10/23/2018 CLINICAL DATA:  Acute shortness of  breath EXAM: PORTABLE CHEST 1 VIEW COMPARISON:  06/13/2018 and prior radiographs FINDINGS: Cardiomegaly and pulmonary vascular congestion noted. There is no evidence of focal airspace disease, pulmonary edema, suspicious pulmonary nodule/mass, pleural effusion, or pneumothorax. No acute bony abnormalities are identified. IMPRESSION: Cardiomegaly with pulmonary vascular congestion. Electronically Signed   By: Harmon Pier M.D.   On: 10/23/2018 18:11   Vas [place] Lower Extremity Venous (dvt)  Result Date: 10/25/2018  Lower Venous Study Indications: Swelling, and SOB.  Limitations: Body habitus and poor ultrasound/tissue interface. Comparison Study: no prior Performing Technologist: Blanch Media RVS  Examination Guidelines: A complete evaluation includes B-mode imaging, spectral Doppler, color Doppler, and power Doppler as needed of all accessible portions of each vessel. Bilateral testing is considered an integral part of a  complete examination. Limited examinations for reoccurring indications may be performed as noted.  +---------+---------------+---------+-----------+----------+--------------+  RIGHT     Compressibility Phasicity Spontaneity Properties Summary         +---------+---------------+---------+-----------+----------+--------------+  CFV       Full            Yes       Yes                                    +---------+---------------+---------+-----------+----------+--------------+  SFJ       Full                                                             +---------+---------------+---------+-----------+----------+--------------+  FV Prox   Full                                                             +---------+---------------+---------+-----------+----------+--------------+  FV Mid    Full                                                             +---------+---------------+---------+-----------+----------+--------------+  FV Distal Full                                                              +---------+---------------+---------+-----------+----------+--------------+  PFV       Full                                                             +---------+---------------+---------+-----------+----------+--------------+  POP       Full            Yes       Yes                                    +---------+---------------+---------+-----------+----------+--------------+  PTV                                                        Not visualized  +---------+---------------+---------+-----------+----------+--------------+  PERO  Not visualized  +---------+---------------+---------+-----------+----------+--------------+   +---------+---------------+---------+-----------+----------+--------------+  LEFT      Compressibility Phasicity Spontaneity Properties Summary         +---------+---------------+---------+-----------+----------+--------------+  CFV       Full            Yes       Yes                                    +---------+---------------+---------+-----------+----------+--------------+  SFJ       Full                                                             +---------+---------------+---------+-----------+----------+--------------+  FV Prox   Full                                                             +---------+---------------+---------+-----------+----------+--------------+  FV Mid    Full                                                             +---------+---------------+---------+-----------+----------+--------------+  FV Distal Full                                                             +---------+---------------+---------+-----------+----------+--------------+  PFV       Full                                                             +---------+---------------+---------+-----------+----------+--------------+  POP       Full            Yes       Yes                                     +---------+---------------+---------+-----------+----------+--------------+  PTV       Full                                                             +---------+---------------+---------+-----------+----------+--------------+  PERO  Not visualized  +---------+---------------+---------+-----------+----------+--------------+     Summary: Right: There is no evidence of deep vein thrombosis in the lower extremity. However, portions of this examination were limited- see technologist comments above. No cystic structure found in the popliteal fossa. Left: There is no evidence of deep vein thrombosis in the lower extremity. However, portions of this examination were limited- see technologist comments above. No cystic structure found in the popliteal fossa.  *See table(s) above for measurements and observations. Electronically signed by Waverly Ferrarihristopher Dickson MD on 10/25/2018 at 11:45:42 AM.    Final     Microbiology: Recent Results (from the past 240 hour(s))  SARS Coronavirus 2 (Hosp order,Performed in [facility] lab via [employer] ID)     Status: None   Collection Time: 10/23/18  5:00 PM   Specimen: Dry Nasal Swab ([employer] ID Now)  Result Value Ref Range Status   SARS Coronavirus 2 (A[employer]ott ID Now) NEGATIVE NEGATIVE Final    Comment: (NOTE) SARS-CoV-2 target nucleic acids are NOT DETECTED. The SARS-CoV-2 RNA is generally detectable in upper and lower respiratory specimens during the acute phase of infection.  Negativeresults do not preclude SARS-CoV-2 infection, do not rule out coinfections with other pathogens, and should not be used as the  sole basis for treatment or other patient management decisions.  Negative results must be combined with clinical observations, patient history, and epidemiological information. The expected result is Negative. Fact Sheet for Patients: http://www.graves-ford.org/https://www.fda.gov/media/136524/download Fact Sheet for Healthcare  Providers: EnviroConcern.sihttps://www.fda.gov/media/136523/download This test is not yet approved or cleared by the [place] FDA and  has been authorized for detection and/or diagnosis of SARS-CoV-2 by FDA under an Emergency Use Authorization (EUA).  This EUA will remain in effect (meaning this test can be used) for the duration of  the COVID19 declaration under Section 5 64(b)(1) of the Act, 21 U.S.C.  section [id number] 3(b)(1), unless the authorization is terminated or revoked sooner. Performed at [facility] [place], [address] [place], [place] [address]   MRSA PCR Screening     Status: None   Collection Time: 10/23/18  9:29 PM   Specimen: Nasal Mucosa; Nasopharyngeal  Result Value Ref Range Status   MRSA by PCR NEGATIVE NEGATIVE Final    Comment:        The GeneXpert MRSA Assay (FDA approved for NASAL specimens only), is one component of a comprehensive MRSA colonization surveillance program. It is not intended to diagnose MRSA infection nor to guide or monitor treatment for MRSA infections. Performed at [facility], [address] [place], [place] [address]   Culture, respiratory (tracheal aspirate)     Status: None   Collection Time: 10/23/18 11:27 PM   Specimen: Tracheal Aspirate; Respiratory  Result Value Ref Range Status   Specimen Description TRACHEAL ASPIRATE  Final   Special Requests NONE  Final   Gram Stain   Final    FEW WBC PRESENT, PREDOMINANTLY PMN FEW GRAM NEGATIVE RODS RARE GRAM POSITIVE COCCI    Culture   Final    Consistent with normal respiratory flora. Performed at [facility], [address] [place], [place] [address]    Report Status 10/26/2018 FINAL  Final     Labs: Basic Metabolic Panel: Recent Labs  Lab 10/23/18 2348 10/24/18 0339 10/24/18 0440 10/24/18 2016 10/25/18 0308 10/26/18 0935 10/27/18 0536  NA 139 139 137 138 139 138 138  K 4.1 3.9 3.2* 3.6 4.2 3.7 4.2  CL 95* 96*  --  96*  96* 93* 94*  CO2 34* 32  --  34*  36* 37* 35*  GLUCOSE 92 92  --  94 112* 162* 96  BUN 14 13  --  14 17 15 16   CREATININE 1.90* 1.94*  --  2.21* 2.40* 1.92* 1.88*  CALCIUM 8.9 9.0  --  8.9 9.1 9.4 9.4  MG  --  2.1  --   --   --   --   --   PHOS 2.6 2.7  --   --   --   --   --    Liver Function Tests: Recent Labs  Lab 10/23/18 1659 10/23/18 2348  AST 41  --   ALT 17  --   ALKPHOS 43  --   BILITOT 1.0  --   PROT 7.5  --   ALBUMIN 3.6 3.4*   No results for input(s): LIPASE, AMYLASE in the last 168 hours. No results for input(s): AMMONIA in the last 168 hours. CBC: Recent Labs  Lab 10/23/18 1659 10/23/18 1935 10/23/18 2254 10/23/18 2348 10/24/18 0339 10/24/18 0440  WBC 6.8  --   --  5.8 8.1  --   NEUTROABS 4.5  --   --   --   --   --   HGB 12.9* 15.0 14.3 13.3 13.3 14.6  HCT 42.4 44.0 42.0 42.0 42.3 43.0  MCV 94.2  --   --  91.5 92.0  --   PLT 163  --   --  167 147*  --    Cardiac Enzymes: Recent Labs  Lab 10/23/18 1659 10/23/18 2348  CKTOTAL  --  1,738*  TROPONINI 0.04* 0.03*   BNP: BNP (last 3 results) Recent Labs    10/23/18 1659  BNP 17.6    ProBNP (last 3 results) No results for input(s): PROBNP in the last 8760 hours.  CBG: Recent Labs  Lab 10/26/18 1213 10/26/18 1302 10/26/18 2147 10/27/18 0808 10/28/18 0717  GLUCAP 64* 102* 114* 71 86       Signed:  Kayleen Memos, MD [facility] 10/28/2018, 11:12 AM

## 2018-10-28 NOTE — Progress Notes (Signed)
   [facility] will sign off.   Medication Recommendations:  As written in previous notes Other recommendations (labs, testing, etc):  Meds as on MAR Follow up as an outpatient:  We will arrange follow up in the [facility]

## 2018-11-07 NOTE — Progress Notes (Deleted)
Cardiology Office Note   Date:  11/07/2018   ID:  Kyle Harrison, DOB May 27, 1980, MRN [id number]  PCP:  Patient, No Pcp Per  Cardiologist:  Hochrein  No chief complaint on file.    History of Present Illness: Kyle Harrison is a 38 y.o. male who presents for posthospitalization follow-up with known history of hypertension, diabetes, OSA on CPAP-machine not working as he does not have all the parts and therefore not using,, morbid obesity, diastolic heart failure, pulmonary hypertension, who was seen on consultation by Dr. Minus Breeding on 10/25/2018 during hospitalization for CHF.  The patient had increasing lower extremity edema and dyspnea along with orthopnea and PND prior to hospitalization.  COVID test was found to be negative during hospitalization.  Echocardiogram was ordered with an EF of 50% to 55% with LVH.  EKG revealed sinus rhythm with right bundle branch block and left anterior fascicular block.  Due to severe respiratory distress the patient was intubated, extubated on 10/24/2018. The patient was diuresed with IV Lasix 60 mg twice daily with removal of 6.7 L.  Home health agency, [place] was consulted to help him with his CPAP machine for better compliance.  Discharge weight 161.2 kg (354.6 lbs).    Past Medical History:  Diagnosis Date  . CHF (congestive heart failure) (HCC)    Preserved EF  . Diabetes mellitus without complication ([place])   . Hypertension   . Morbid obesity ([place])   . Smoker     No past surgical history on file.   Current Outpatient Medications  Medication Sig Dispense Refill  . fluticasone (FLONASE) 50 MCG/ACT nasal spray Place 2 sprays into both nostrils daily. 15.8 mL 0  . furosemide (LASIX) 40 MG tablet Take 1 tablet (40 mg total) by mouth 2 (two) times daily. 60 tablet 0  . levothyroxine (SYNTHROID) 75 MCG tablet Take 1 tablet (75 mcg total) by mouth daily at 6 (six) AM. 30 tablet 0  . metoprolol succinate (TOPROL-XL) 50 MG 24 hr tablet Take 1  tablet (50 mg total) by mouth daily. Take with or immediately following a meal. 30 tablet 0  . potassium chloride (K-DUR) 10 MEQ tablet Take 1 tablet (10 mEq total) by mouth daily. 30 tablet 0   No current facility-administered medications for this visit.     Allergies:   Patient has no known allergies.    Social History:  The patient  reports that he has been smoking. He has been smoking about 0.50 packs per day. He has never used smokeless tobacco. He reports that he does not drink alcohol or use drugs.   Family History:  The patient's family history includes Graves' disease in his mother; Hypertension in his mother.    ROS: All other systems are reviewed and negative. Unless otherwise mentioned in H&P    PHYSICAL EXAM: VS:  There were no vitals taken for this visit. , BMI There is no height or weight on file to calculate BMI. GEN: Well nourished, well developed, in no acute distress HEENT: normal Neck: no JVD, carotid bruits, or masses Cardiac: ***RRR; no murmurs, rubs, or gallops,no edema  Respiratory:  Clear to auscultation bilaterally, normal work of breathing GI: soft, nontender, nondistended, + BS MS: no deformity or atrophy Skin: warm and dry, no rash Neuro:  Strength and sensation are intact Psych: euthymic mood, full affect   EKG:  EKG {ACTION; IS/IS [id number]} ordered today. The ekg ordered today demonstrates ***   Recent Labs: 10/23/2018: ALT 17; B Natriuretic  Peptide 17.6 10/24/2018: Hemoglobin 14.6; Magnesium 2.1; Platelets 147 10/27/2018: BUN 16; Creatinine, Ser 1.88; Potassium 4.2; Sodium 138    Lipid Panel No results found for: CHOL, TRIG, HDL, CHOLHDL, VLDL, LDLCALC, LDLDIRECT    Wt Readings from Last 3 Encounters:  10/25/18 (!) 355 lb 6.1 oz (161.2 kg)  12/10/10 200 lb (90.7 kg)      Other studies Reviewed: Echocardiogram 10/24/2018 1. The left ventricle has low normal systolic function, with an ejection fraction of 50-55%. The cavity size was  normal. There is moderately increased left ventricular wall thickness. Left ventricular diastolic parameters were normal.  2. The mitral valve is grossly normal.  3. The tricuspid valve is grossly normal.  4. The aortic valve is tricuspid. No stenosis of the aortic valve.  5. Low normal to mildly reduced LV systolic function; moderate LVH; prominent apical trabeculae; would consider cardiac MR to R/O noncompaction.   ASSESSMENT AND PLAN:  1.  ***   Current medicines are reviewed at length with the patient today.    Labs/ tests ordered today include: *** Bettey MareKathryn M. Liborio NixonLawrence DNP, ANP, AACC   11/07/2018 2:32 PM    [facility] [address] Office [phone] Fax [phone]

## 2018-11-08 ENCOUNTER — Telehealth: Payer: Self-pay | Admitting: Adult Health

## 2018-11-08 NOTE — Telephone Encounter (Signed)
I called pt, no answer and voicemail was cutting off.

## 2018-11-09 ENCOUNTER — Ambulatory Visit: Payer: Self-pay | Admitting: Adult Health

## 2018-12-05 ENCOUNTER — Telehealth (HOSPITAL_COMMUNITY): Payer: Self-pay | Admitting: Cardiology

## 2018-12-05 NOTE — Telephone Encounter (Signed)
Called and left message for patient to call back.  Per Kevan Rosebush, RN, we can schedule for NP appt tomorrow 12/06/2018 @12  noon with Dr. Aundra Dubin.

## 2018-12-06 NOTE — Telephone Encounter (Signed)
Called and left 2nd message for pt to call back to schedule New Pt appt with Dr. Aundra Dubin.  Opening today 12/06/2018 @12  noon or 12/07/2018 @11 :20.

## 2018-12-06 NOTE — Telephone Encounter (Signed)
Patient returned my call.  Aware of appt 12/07/2018 @11 :20 with Dr. Aundra Dubin.

## 2018-12-07 ENCOUNTER — Encounter (HOSPITAL_COMMUNITY): Payer: Self-pay | Admitting: *Deleted

## 2018-12-07 ENCOUNTER — Other Ambulatory Visit: Payer: Self-pay

## 2018-12-07 ENCOUNTER — Ambulatory Visit (HOSPITAL_COMMUNITY)
Admission: RE | Admit: 2018-12-07 | Discharge: 2018-12-07 | Disposition: A | Discharge status: Home / Self Care | Payer: Self-pay | Source: Ambulatory Visit | Attending: Cardiology | Admitting: Cardiology

## 2018-12-07 ENCOUNTER — Encounter (HOSPITAL_COMMUNITY): Payer: Self-pay | Admitting: Cardiology

## 2018-12-07 ENCOUNTER — Other Ambulatory Visit (HOSPITAL_COMMUNITY): Payer: Self-pay | Admitting: *Deleted

## 2018-12-07 VITALS — BP 120/86 | HR 79 | Ht 71.0 in | Wt 358.6 lb

## 2018-12-07 DIAGNOSIS — I5032 Chronic diastolic (congestive) heart failure: Secondary | ICD-10-CM

## 2018-12-07 DIAGNOSIS — E1122 Type 2 diabetes mellitus with diabetic chronic kidney disease: Secondary | ICD-10-CM | POA: Insufficient documentation

## 2018-12-07 DIAGNOSIS — Z8349 Family history of other endocrine, nutritional and metabolic diseases: Secondary | ICD-10-CM | POA: Insufficient documentation

## 2018-12-07 DIAGNOSIS — I272 Pulmonary hypertension, unspecified: Secondary | ICD-10-CM

## 2018-12-07 DIAGNOSIS — Z79890 Hormone replacement therapy (postmenopausal): Secondary | ICD-10-CM | POA: Insufficient documentation

## 2018-12-07 DIAGNOSIS — Z79899 Other long term (current) drug therapy: Secondary | ICD-10-CM | POA: Insufficient documentation

## 2018-12-07 DIAGNOSIS — Z7951 Long term (current) use of inhaled steroids: Secondary | ICD-10-CM | POA: Insufficient documentation

## 2018-12-07 DIAGNOSIS — N183 Chronic kidney disease, stage 3 (moderate): Secondary | ICD-10-CM | POA: Insufficient documentation

## 2018-12-07 DIAGNOSIS — Z87891 Personal history of nicotine dependence: Secondary | ICD-10-CM | POA: Insufficient documentation

## 2018-12-07 DIAGNOSIS — Z8249 Family history of ischemic heart disease and other diseases of the circulatory system: Secondary | ICD-10-CM | POA: Insufficient documentation

## 2018-12-07 DIAGNOSIS — G4733 Obstructive sleep apnea (adult) (pediatric): Secondary | ICD-10-CM

## 2018-12-07 DIAGNOSIS — I130 Hypertensive heart and chronic kidney disease with heart failure and stage 1 through stage 4 chronic kidney disease, or unspecified chronic kidney disease: Secondary | ICD-10-CM | POA: Insufficient documentation

## 2018-12-07 DIAGNOSIS — Z6841 Body Mass Index (BMI) 40.0 and over, adult: Secondary | ICD-10-CM | POA: Insufficient documentation

## 2018-12-07 DIAGNOSIS — Z9989 Dependence on other enabling machines and devices: Secondary | ICD-10-CM

## 2018-12-07 LAB — CBC
HCT: 43.6 % (ref 39.0–52.0)
Hemoglobin: 13.6 g/dL (ref 13.0–17.0)
MCH: 29.1 pg (ref 26.0–34.0)
MCHC: 31.2 g/dL (ref 30.0–36.0)
MCV: 93.4 fL (ref 80.0–100.0)
Platelets: 192 10*3/uL (ref 150–400)
RBC: 4.67 MIL/uL (ref 4.22–5.81)
RDW: 15.3 % (ref 11.5–15.5)
WBC: 7 10*3/uL (ref 4.0–10.5)
nRBC: 0 % (ref 0.0–0.2)

## 2018-12-07 LAB — BASIC METABOLIC PANEL
Anion gap: 11 (ref 5–15)
BUN: 11 mg/dL (ref 6–20)
CO2: 26 mmol/L (ref 22–32)
Calcium: 8.7 mg/dL — ABNORMAL LOW (ref 8.9–10.3)
Chloride: 100 mmol/L (ref 98–111)
Creatinine, Ser: 1.46 mg/dL — ABNORMAL HIGH (ref 0.61–1.24)
GFR calc Af Amer: >60 mL/min (ref >60)
GFR calc non Af Amer: >60 mL/min (ref >60)
Glucose, Bld: 110 mg/dL — ABNORMAL HIGH (ref 70–99)
Potassium: 3.7 mmol/L (ref 3.5–5.1)
Sodium: 137 mmol/L (ref 135–145)

## 2018-12-07 LAB — PROTIME-INR
INR: 1.4 — ABNORMAL HIGH (ref 0.8–1.2)
Prothrombin Time: 16.7 seconds — ABNORMAL HIGH (ref 11.4–15.2)

## 2018-12-07 MED ORDER — SODIUM CHLORIDE 0.9% FLUSH: 3.0000 mL | Freq: Two times a day (BID) | INTRAVENOUS | Status: DC

## 2018-12-07 MED ORDER — TORSEMIDE 20 MG PO TABS: 40.0000 mg | ORAL_TABLET | Freq: Every day | ORAL | 3 refills | Status: DC

## 2018-12-07 NOTE — Patient Instructions (Signed)
Labs done today. We will contact you only if your labs are abnormal.  DISCONTINUE Furosemide (Lasix)   START Torsemide 40mg  (2 tab) daily.   Your physician recommends that you schedule a follow-up appointment in: 1 month with Darrick Grinder, NP  You have been referred to Dr. Fransico Him for Sleep Apnea. Her office will contact you to schedule an appointment.    Your physician has requested that you have a cardiac catheterization.  Please follow instruction sheet, as given.   At the [facility], you and your health needs are our priority. As part of our continuing mission to provide you with exceptional heart care, we have created designated Provider Care Teams. These Care Teams include your primary Cardiologist (physician) and Advanced Practice Providers (APPs- Physician Assistants and Nurse Practitioners) who all work together to provide you with the care you need, when you need it.   You may see any of the following providers on your designated Care Team at your next follow up: Marland Kitchen Dr Glori Bickers . Dr Loralie Champagne . Darrick Grinder, NP   Please be sure to bring in all your medications bottles to every appointment.

## 2018-12-07 NOTE — Progress Notes (Signed)
CSW referred to assist patient with financial resources and disability. Patient reports he resides with his brother who assists with housing and food needs. Patient reports he has food stamps but has been denied for medicaid in the past. Patient's brother was with him during the visit as he also provides all transportation. Patient was falling asleep throughout the visit and brother reports he has been unable to use his CPAP due to missing equipment. CSW discussed at length the process for medicaid and disability applications and encouraged patient to complete applications for both. CSW also assisted with obtaining CPAP equipment through [employer] [facility] through the assistance of the Patient [place]. Patient will receive the equipment via delivery from [employer] within 7-10 days. Patient and brother grateful for the support and assistance. CSW will continue to be available as needed. Raquel Sarna, [place], [place]

## 2018-12-07 NOTE — H&P (View-Only) (Signed)
Cardiology: Dr. Shirlee LatchMcLean  38 y.o. with history OSA/?OHS, morbid obesity, and diastolic CHF was referred by NP Lyman BishopLawrence for treatment of CHF.  Patient has a history of OSA, suspect severe, but is not using CPAP as his machine is missing a part.  He is not using oxygen during the day.  HE was admitted in 6/20 with acute on chronic diastolic CHF.  Echo showed EF 50-55%, ?LV noncompaction.  RV was not visualized well and unable to estimate PA pressure.  He was diuresed and discharged home.  He has no insurance.  He was recently incarcerated.   He presents with his brother who gives much of the history. Patient falls asleep repetitively while we are talking.  Per his brother, he stops breathing at night frequently.  He is short of breath walking 50 feet.  He is short of breath walking in the house.  If he has to walk more than 2 minutes, he will have to stop.  +Orthopnea.  No chest pain.  No lightheadedness or syncope.   ECG (personally reviewed, 6/20): NSR, RBBB, LAFB  Labs (6/20): K 4.2, creatinine 1.88  PMH: 1. OSA (severe), suspect OHS: Not using CPAP as machine is broken.  2. Morbid obesity.  3. HTN 4. Type 2 diabetes 5. CKD stage 3: Suspect due to diabetes and HTN.  6. Active smoker 7. Chronic diastolic CHF:  - Echo (6/20): EF 50-55%, mild LVH, prominent apical trabeculations raising concern for noncompaction, RV normal size/systolic function.   Social History   Socioeconomic History  . Marital status: Single    Spouse name: Not on file  . Number of children: Not on file  . Years of education: Not on file  . Highest education level: Not on file  Occupational History  . Not on file  Social Needs  . Financial resource strain: Not on file  . Food insecurity    Worry: Not on file    Inability: Not on file  . Transportation needs    Medical: Not on file    Non-medical: Not on file  Tobacco Use  . Smoking status: Former Smoker    Packs/day: 0.50  . Smokeless tobacco: Never Used   Substance and Sexual Activity  . Alcohol use: No  . Drug use: No  . Sexual activity: Never  Lifestyle  . Physical activity    Days per week: Not on file    Minutes per session: Not on file  . Stress: Not on file  Relationships  . Social Musicianconnections    Talks on phone: Not on file    Gets together: Not on file    Attends religious service: Not on file    Active member of club or organization: Not on file    Attends meetings of clubs or organizations: Not on file    Relationship status: Not on file  . Intimate partner violence    Fear of current or ex partner: Not on file    Emotionally abused: Not on file    Physically abused: Not on file    Forced sexual activity: Not on file  Other Topics Concern  . Not on file  Social History Narrative  . Not on file   Family History  Problem Relation Age of Onset  . Graves' disease Mother   . Hypertension Mother    ROS: All systems reviewed and negative except as per [facility].   Current Outpatient Medications  Medication Sig Dispense Refill  . fluticasone (FLONASE) 50 MCG/ACT nasal  spray Place 2 sprays into both nostrils daily. 15.8 mL 0  . levothyroxine (SYNTHROID) 75 MCG tablet Take 1 tablet (75 mcg total) by mouth daily at 6 (six) AM. 30 tablet 0  . metoprolol succinate (TOPROL-XL) 50 MG 24 hr tablet Take 1 tablet (50 mg total) by mouth daily. Take with or immediately following a meal. 30 tablet 0  . potassium chloride (K-DUR) 10 MEQ tablet Take 1 tablet (10 mEq total) by mouth daily. 30 tablet 0  . torsemide (DEMADEX) 20 MG tablet Take 2 tablets (40 mg total) by mouth daily. 180 tablet 3   No current facility-administered medications for this encounter.    BP 120/86   Pulse 79   Ht 5\' 11"  (1.803 m)   Wt (!) 162.7 kg (358 lb 9.6 oz)   SpO2 96%   BMI 50.01 kg/m  General: NAD, obese.  Neck: JVP 9-10 cm, no thyromegaly or thyroid nodule.  Lungs: Clear to auscultation bilaterally with normal respiratory effort. CV: Nondisplaced  PMI.  Heart regular S1/S2, no S3/S4, no murmur.  1+ edema to knees bilaterally.  No carotid bruit.  Normal pedal pulses.  Abdomen: Soft, nontender, no hepatosplenomegaly, no distention.  Skin: Intact without lesions or rashes.  Neurologic: Alert and oriented x 3.  Psych: Normal affect. Extremities: No clubbing or cyanosis.  HEENT: Normal.   Assessment/Plan: 1. OSA/?OHS: Patient has very severe OSA.  He fell asleep multiple times talking to me today.  He stops breathing at night per his brother.  His CPAP machine is missing a part and he is not using.  - I will refer to Dr Radford Pax to see if we can get him back on CPAP expeditiously.  - He may need oxygen during the day, but oxygen saturation was 96% here today.  Will work on diuresis, then will check ambulatory sats.  2. Chronic diastolic CHF: RV not well visualized on echo, but I suspect he has significant pulmonary hypertension with RV failure in the setting of OHS/OSA.  He is volume overloaded on exam. NYHA class IIIb symptoms.  - Important to treat OSA and OHS (get back on CPAP, check ambulatory sats).   - Stop Lasix, start torsemide 60 mg daily.  BMET today and again in 10 days.  - I will arrange for RHC to assess PA pressure and RV function. We discussed risks/benefits and he agrees to procedure.  3. CKD: Stage 3. Follow creatinine closely with diuresis.  This is worrisome for significant RV failure.  4. Smoking: I strongly encouraged him to quit.  5. I will have our HF social worker speak with him regarding getting health insurance and working on disability.   Loralie Champagne 12/07/2018

## 2018-12-07 NOTE — Progress Notes (Signed)
Cardiology: Dr. Marlee Trentman  38 y.o. with history OSA/?OHS, morbid obesity, and diastolic CHF was referred by NP Lawrence for treatment of CHF.  Patient has a history of OSA, suspect severe, but is not using CPAP as his machine is missing a part.  He is not using oxygen during the day.  HE was admitted in 6/20 with acute on chronic diastolic CHF.  Echo showed EF 50-55%, ?LV noncompaction.  RV was not visualized well and unable to estimate PA pressure.  He was diuresed and discharged home.  He has no insurance.  He was recently incarcerated.   He presents with his brother who gives much of the history. Patient falls asleep repetitively while we are talking.  Per his brother, he stops breathing at night frequently.  He is short of breath walking 50 feet.  He is short of breath walking in the house.  If he has to walk more than 2 minutes, he will have to stop.  +Orthopnea.  No chest pain.  No lightheadedness or syncope.   ECG (personally reviewed, 6/20): NSR, RBBB, LAFB  Labs (6/20): K 4.2, creatinine 1.88  PMH: 1. OSA (severe), suspect OHS: Not using CPAP as machine is broken.  2. Morbid obesity.  3. HTN 4. Type 2 diabetes 5. CKD stage 3: Suspect due to diabetes and HTN.  6. Active smoker 7. Chronic diastolic CHF:  - Echo (6/20): EF 50-55%, mild LVH, prominent apical trabeculations raising concern for noncompaction, RV normal size/systolic function.   Social History   Socioeconomic History  . Marital status: Single    Spouse name: Not on file  . Number of children: Not on file  . Years of education: Not on file  . Highest education level: Not on file  Occupational History  . Not on file  Social Needs  . Financial resource strain: Not on file  . Food insecurity    Worry: Not on file    Inability: Not on file  . Transportation needs    Medical: Not on file    Non-medical: Not on file  Tobacco Use  . Smoking status: Former Smoker    Packs/day: 0.50  . Smokeless tobacco: Never Used   Substance and Sexual Activity  . Alcohol use: No  . Drug use: No  . Sexual activity: Never  Lifestyle  . Physical activity    Days per week: Not on file    Minutes per session: Not on file  . Stress: Not on file  Relationships  . Social connections    Talks on phone: Not on file    Gets together: Not on file    Attends religious service: Not on file    Active member of club or organization: Not on file    Attends meetings of clubs or organizations: Not on file    Relationship status: Not on file  . Intimate partner violence    Fear of current or ex partner: Not on file    Emotionally abused: Not on file    Physically abused: Not on file    Forced sexual activity: Not on file  Other Topics Concern  . Not on file  Social History Narrative  . Not on file   Family History  Problem Relation Age of Onset  . Graves' disease Mother   . Hypertension Mother    ROS: All systems reviewed and negative except as per [facility].   Current Outpatient Medications  Medication Sig Dispense Refill  . fluticasone (FLONASE) 50 MCG/ACT nasal   spray Place 2 sprays into both nostrils daily. 15.8 mL 0  . levothyroxine (SYNTHROID) 75 MCG tablet Take 1 tablet (75 mcg total) by mouth daily at 6 (six) AM. 30 tablet 0  . metoprolol succinate (TOPROL-XL) 50 MG 24 hr tablet Take 1 tablet (50 mg total) by mouth daily. Take with or immediately following a meal. 30 tablet 0  . potassium chloride (K-DUR) 10 MEQ tablet Take 1 tablet (10 mEq total) by mouth daily. 30 tablet 0  . torsemide (DEMADEX) 20 MG tablet Take 2 tablets (40 mg total) by mouth daily. 180 tablet 3   No current facility-administered medications for this encounter.    BP 120/86   Pulse 79   Ht 5\' 11"  (1.803 m)   Wt (!) 162.7 kg (358 lb 9.6 oz)   SpO2 96%   BMI 50.01 kg/m  General: NAD, obese.  Neck: JVP 9-10 cm, no thyromegaly or thyroid nodule.  Lungs: Clear to auscultation bilaterally with normal respiratory effort. CV: Nondisplaced  PMI.  Heart regular S1/S2, no S3/S4, no murmur.  1+ edema to knees bilaterally.  No carotid bruit.  Normal pedal pulses.  Abdomen: Soft, nontender, no hepatosplenomegaly, no distention.  Skin: Intact without lesions or rashes.  Neurologic: Alert and oriented x 3.  Psych: Normal affect. Extremities: No clubbing or cyanosis.  HEENT: Normal.   Assessment/Plan: 1. OSA/?OHS: Patient has very severe OSA.  He fell asleep multiple times talking to me today.  He stops breathing at night per his brother.  His CPAP machine is missing a part and he is not using.  - I will refer to Dr Radford Pax to see if we can get him back on CPAP expeditiously.  - He may need oxygen during the day, but oxygen saturation was 96% here today.  Will work on diuresis, then will check ambulatory sats.  2. Chronic diastolic CHF: RV not well visualized on echo, but I suspect he has significant pulmonary hypertension with RV failure in the setting of OHS/OSA.  He is volume overloaded on exam. NYHA class IIIb symptoms.  - Important to treat OSA and OHS (get back on CPAP, check ambulatory sats).   - Stop Lasix, start torsemide 60 mg daily.  BMET today and again in 10 days.  - I will arrange for RHC to assess PA pressure and RV function. We discussed risks/benefits and he agrees to procedure.  3. CKD: Stage 3. Follow creatinine closely with diuresis.  This is worrisome for significant RV failure.  4. Smoking: I strongly encouraged him to quit.  5. I will have our HF social worker speak with him regarding getting health insurance and working on disability.   Loralie Champagne 12/07/2018

## 2018-12-12 ENCOUNTER — Inpatient Hospital Stay (HOSPITAL_COMMUNITY): Admission: RE | Admit: 2018-12-12 | Payer: Self-pay | Source: Ambulatory Visit

## 2018-12-13 ENCOUNTER — Other Ambulatory Visit (HOSPITAL_COMMUNITY)
Admission: RE | Admit: 2018-12-13 | Discharge: 2018-12-13 | Disposition: A | Discharge status: Home / Self Care | Payer: Self-pay | Source: Ambulatory Visit | Attending: Cardiology | Admitting: Cardiology

## 2018-12-13 DIAGNOSIS — Z20828 Contact with and (suspected) exposure to other viral communicable diseases: Secondary | ICD-10-CM | POA: Insufficient documentation

## 2018-12-13 DIAGNOSIS — Z01812 Encounter for preprocedural laboratory examination: Secondary | ICD-10-CM | POA: Insufficient documentation

## 2018-12-13 LAB — SARS CORONAVIRUS 2 (TAT 6-24 HRS): SARS Coronavirus 2: NEGATIVE

## 2018-12-15 ENCOUNTER — Ambulatory Visit (HOSPITAL_COMMUNITY)
Admission: RE | Admit: 2018-12-15 | Discharge: 2018-12-15 | Disposition: A | Discharge status: Home / Self Care | Payer: Self-pay | Attending: Cardiology | Admitting: Cardiology

## 2018-12-15 ENCOUNTER — Other Ambulatory Visit: Payer: Self-pay

## 2018-12-15 ENCOUNTER — Encounter (HOSPITAL_COMMUNITY)
Admission: RE | Disposition: A | Discharge status: Home / Self Care | Payer: Self-pay | Source: Home / Self Care | Attending: Cardiology

## 2018-12-15 DIAGNOSIS — Z87891 Personal history of nicotine dependence: Secondary | ICD-10-CM | POA: Insufficient documentation

## 2018-12-15 DIAGNOSIS — I5032 Chronic diastolic (congestive) heart failure: Secondary | ICD-10-CM | POA: Insufficient documentation

## 2018-12-15 DIAGNOSIS — Z79899 Other long term (current) drug therapy: Secondary | ICD-10-CM | POA: Insufficient documentation

## 2018-12-15 DIAGNOSIS — I272 Pulmonary hypertension, unspecified: Secondary | ICD-10-CM | POA: Insufficient documentation

## 2018-12-15 DIAGNOSIS — I130 Hypertensive heart and chronic kidney disease with heart failure and stage 1 through stage 4 chronic kidney disease, or unspecified chronic kidney disease: Secondary | ICD-10-CM | POA: Insufficient documentation

## 2018-12-15 DIAGNOSIS — I509 Heart failure, unspecified: Secondary | ICD-10-CM

## 2018-12-15 DIAGNOSIS — Z6841 Body Mass Index (BMI) 40.0 and over, adult: Secondary | ICD-10-CM | POA: Insufficient documentation

## 2018-12-15 DIAGNOSIS — G4733 Obstructive sleep apnea (adult) (pediatric): Secondary | ICD-10-CM | POA: Insufficient documentation

## 2018-12-15 DIAGNOSIS — E1122 Type 2 diabetes mellitus with diabetic chronic kidney disease: Secondary | ICD-10-CM | POA: Insufficient documentation

## 2018-12-15 DIAGNOSIS — Z79890 Hormone replacement therapy (postmenopausal): Secondary | ICD-10-CM | POA: Insufficient documentation

## 2018-12-15 DIAGNOSIS — Z8249 Family history of ischemic heart disease and other diseases of the circulatory system: Secondary | ICD-10-CM | POA: Insufficient documentation

## 2018-12-15 DIAGNOSIS — N183 Chronic kidney disease, stage 3 (moderate): Secondary | ICD-10-CM | POA: Insufficient documentation

## 2018-12-15 HISTORY — PX: RIGHT HEART CATH: CATH118263

## 2018-12-15 LAB — POCT I-STAT EG7
Acid-Base Excess: 4 mmol/L — ABNORMAL HIGH (ref 0.0–2.0)
Acid-Base Excess: 5 mmol/L — ABNORMAL HIGH (ref 0.0–2.0)
Bicarbonate: 31.8 mmol/L — ABNORMAL HIGH (ref 20.0–28.0)
Bicarbonate: 32.5 mmol/L — ABNORMAL HIGH (ref 20.0–28.0)
Calcium, Ion: 1.24 mmol/L (ref 1.15–1.40)
Calcium, Ion: 1.28 mmol/L (ref 1.15–1.40)
HCT: 43 % (ref 39.0–52.0)
HCT: 43 % (ref 39.0–52.0)
Hemoglobin: 14.6 g/dL (ref 13.0–17.0)
Hemoglobin: 14.6 g/dL (ref 13.0–17.0)
O2 Saturation: 70 %
O2 Saturation: 70 %
Potassium: 3.8 mmol/L (ref 3.5–5.1)
Potassium: 3.8 mmol/L (ref 3.5–5.1)
Sodium: 140 mmol/L (ref 135–145)
Sodium: 140 mmol/L (ref 135–145)
TCO2: 34 mmol/L — ABNORMAL HIGH (ref 22–32)
TCO2: 34 mmol/L — ABNORMAL HIGH (ref 22–32)
pCO2, Ven: 58 mmHg (ref 44.0–60.0)
pCO2, Ven: 58 mmHg (ref 44.0–60.0)
pH, Ven: 7.347 (ref 7.250–7.430)
pH, Ven: 7.357 (ref 7.250–7.430)
pO2, Ven: 39 mmHg (ref 32.0–45.0)
pO2, Ven: 39 mmHg (ref 32.0–45.0)

## 2018-12-15 LAB — BASIC METABOLIC PANEL
Anion gap: 9 (ref 5–15)
BUN: 15 mg/dL (ref 6–20)
CO2: 32 mmol/L (ref 22–32)
Calcium: 9.4 mg/dL (ref 8.9–10.3)
Chloride: 97 mmol/L — ABNORMAL LOW (ref 98–111)
Creatinine, Ser: 1.47 mg/dL — ABNORMAL HIGH (ref 0.61–1.24)
GFR calc Af Amer: >60 mL/min (ref >60)
GFR calc non Af Amer: 60 mL/min — ABNORMAL LOW (ref >60)
Glucose, Bld: 85 mg/dL (ref 70–99)
Potassium: 3.9 mmol/L (ref 3.5–5.1)
Sodium: 138 mmol/L (ref 135–145)

## 2018-12-15 LAB — GLUCOSE, CAPILLARY: Glucose-Capillary: 72 mg/dL (ref 70–99)

## 2018-12-15 SURGERY — RIGHT HEART CATH
Anesthesia: LOCAL

## 2018-12-15 MED ORDER — SODIUM CHLORIDE 0.9% FLUSH: 3.0000 mL | Freq: Two times a day (BID) | INTRAVENOUS | Status: DC

## 2018-12-15 MED ORDER — SODIUM CHLORIDE 0.9 % IV SOLN: 250.0000 mL | INTRAVENOUS | Status: DC | PRN

## 2018-12-15 MED ORDER — ONDANSETRON HCL 4 MG/2ML IJ SOLN: 4.0000 mg | Freq: Four times a day (QID) | INTRAMUSCULAR | Status: DC | PRN

## 2018-12-15 MED ORDER — SODIUM CHLORIDE 0.9% FLUSH: 3.0000 mL | INTRAVENOUS | Status: DC | PRN

## 2018-12-15 MED ORDER — LABETALOL HCL 5 MG/ML IV SOLN: 10.0000 mg | INTRAVENOUS | Status: DC | PRN

## 2018-12-15 MED ORDER — ACETAMINOPHEN 325 MG PO TABS: 650.0000 mg | ORAL_TABLET | ORAL | Status: DC | PRN

## 2018-12-15 MED ORDER — SODIUM CHLORIDE 0.9 % IV SOLN: INTRAVENOUS | Status: DC

## 2018-12-15 MED ORDER — ASPIRIN 81 MG PO CHEW
CHEWABLE_TABLET | ORAL | Status: AC
  Administered 2018-12-15: 11:00:00 81 mg via ORAL
  Filled 2018-12-15: qty 1

## 2018-12-15 MED ORDER — HEPARIN (PORCINE) IN NACL 1000-0.9 UT/500ML-% IV SOLN
INTRAVENOUS | Status: DC | PRN
  Administered 2018-12-15: 500 mL

## 2018-12-15 MED ORDER — HEPARIN (PORCINE) IN NACL 1000-0.9 UT/500ML-% IV SOLN
INTRAVENOUS | Status: AC
  Filled 2018-12-15: qty 500

## 2018-12-15 MED ORDER — HYDRALAZINE HCL 20 MG/ML IJ SOLN
INTRAMUSCULAR | Status: AC
  Filled 2018-12-15: qty 1

## 2018-12-15 MED ORDER — LIDOCAINE HCL (PF) 1 % IJ SOLN
INTRAMUSCULAR | Status: AC
  Filled 2018-12-15: qty 30

## 2018-12-15 MED ORDER — LIDOCAINE HCL (PF) 1 % IJ SOLN
INTRAMUSCULAR | Status: DC | PRN
  Administered 2018-12-15: 2 mL

## 2018-12-15 MED ORDER — ASPIRIN 81 MG PO CHEW
81.0000 mg | CHEWABLE_TABLET | ORAL | Status: AC
  Administered 2018-12-15: 11:00:00 81 mg via ORAL

## 2018-12-15 MED ORDER — HYDRALAZINE HCL 20 MG/ML IJ SOLN: 10.0000 mg | INTRAMUSCULAR | Status: DC | PRN

## 2018-12-15 SURGICAL SUPPLY — 4 items
CATH BALLN WEDGE 5F 110CM (CATHETERS) ×1 IMPLANT
PACK CARDIAC CATHETERIZATION (CUSTOM PROCEDURE TRAY) ×2 IMPLANT
SHEATH GLIDE SLENDER 4/5FR (SHEATH) ×1 IMPLANT
TRANSDUCER W/STOPCOCK (MISCELLANEOUS) ×2 IMPLANT

## 2018-12-15 NOTE — Interval H&P Note (Signed)
History and Physical Interval Note:  12/15/2018 12:29 PM  Kyle Harrison  has presented today for surgery, with the diagnosis of pulmonary hypertention.  The various methods of treatment have been discussed with the patient and family. After consideration of risks, benefits and other options for treatment, the patient has consented to  Procedure(s): RIGHT HEART CATH (N/A) as a surgical intervention.  The patient's history has been reviewed, patient examined, no change in status, stable for surgery.  I have reviewed the patient's chart and labs.  Questions were answered to the patient's satisfaction.     Itzae Miralles [employer]

## 2018-12-15 NOTE — Interval H&P Note (Signed)
History and Physical Interval Note:  12/15/2018 12:28 PM  Nabil Hall Busing Gassman  has presented today for surgery, with the diagnosis of pulmonary hypertention.  The various methods of treatment have been discussed with the patient and family. After consideration of risks, benefits and other options for treatment, the patient has consented to  Procedure(s): RIGHT HEART CATH (N/A) as a surgical intervention.  The patient's history has been reviewed, patient examined, no change in status, stable for surgery.  I have reviewed the patient's chart and labs.  Questions were answered to the patient's satisfaction.     Diem Pagnotta [employer]

## 2018-12-15 NOTE — Discharge Instructions (Signed)
Venogram, Care After °This sheet gives you information about how to care for yourself after your procedure. Your health care provider may also give you more specific instructions. If you have problems or questions, contact your health care provider. °What can I expect after the procedure? °After the procedure, it is common to have: °· Bruising or mild discomfort in the area where the IV was inserted (insertion site). °Follow these instructions at home: °Eating and drinking ° °· Follow instructions from your health care provider about eating or drinking restrictions. °· Drink a lot of fluids for the first several days after the procedure, as directed by your health care provider. This helps to wash (flush) the contrast out of your body. Examples of healthy fluids include water or low-calorie drinks. °General instructions °· Check your IV insertion area every day for signs of infection. Check for: °? Redness, swelling, or pain. °? Fluid or blood. °? Warmth. °? Pus or a bad smell. °· Take over-the-counter and prescription medicines only as told by your health care provider. °· Rest and return to your normal activities as told by your health care provider. Ask your health care provider what activities are safe for you. °· Do not drive for 24 hours if you were given a medicine to help you relax (sedative), or until your health care provider approves. °· Keep all follow-up visits as told by your health care provider. This is important. °Contact a health care provider if: °· Your skin becomes itchy or you develop a rash or hives. °· You have a fever that does not get better with medicine. °· You feel nauseous. °· You vomit. °· You have redness, swelling, or pain around the insertion site. °· You have fluid or blood coming from the insertion site. °· Your insertion area feels warm to the touch. °· You have pus or a bad smell coming from the insertion site. °Get help right away if: °· You have difficulty breathing or  shortness of breath. °· You develop chest pain. °· You faint. °· You feel very dizzy. °These symptoms may represent a serious problem that is an emergency. Do not wait to see if the symptoms will go away. Get medical help right away. Call your local emergency services (911 in the [place] Do not drive yourself to the hospital. °Summary °· After your procedure, it is common to have bruising or mild discomfort in the area where the IV was inserted. °· You should check your IV insertion area every day for signs of infection. °· Take over-the-counter and prescription medicines only as told by your health care provider. °· You should drink a lot of fluids for the first several days after the procedure to help flush the contrast from your body. °This information is not intended to replace advice given to you by your health care provider. Make sure you discuss any questions you have with your health care provider. °Document Released: 02/08/2013 Document Revised: 04/02/2017 Document Reviewed: 03/14/2016 °Elsevier Patient Education © 2020 [employer] ° °

## 2018-12-16 ENCOUNTER — Encounter (HOSPITAL_COMMUNITY): Payer: Self-pay | Admitting: Cardiology

## 2019-01-10 ENCOUNTER — Encounter (HOSPITAL_COMMUNITY): Payer: Self-pay

## 2019-02-03 ENCOUNTER — Ambulatory Visit: Payer: Self-pay | Admitting: Cardiology

## 2021-02-03 ENCOUNTER — Ambulatory Visit: Payer: Self-pay | Attending: Physician Assistant | Admitting: Physician Assistant

## 2021-02-03 ENCOUNTER — Encounter: Payer: Self-pay | Admitting: Physician Assistant

## 2021-02-03 ENCOUNTER — Other Ambulatory Visit: Payer: Self-pay

## 2021-02-03 ENCOUNTER — Ambulatory Visit: Payer: Self-pay | Admitting: Physician Assistant

## 2021-02-03 DIAGNOSIS — F1410 Cocaine abuse, uncomplicated: Secondary | ICD-10-CM | POA: Insufficient documentation

## 2021-02-03 DIAGNOSIS — I10 Essential (primary) hypertension: Secondary | ICD-10-CM

## 2021-02-03 DIAGNOSIS — G4730 Sleep apnea, unspecified: Secondary | ICD-10-CM

## 2021-02-03 DIAGNOSIS — F1010 Alcohol abuse, uncomplicated: Secondary | ICD-10-CM | POA: Insufficient documentation

## 2021-02-03 DIAGNOSIS — E0500 Thyrotoxicosis with diffuse goiter without thyrotoxic crisis or storm: Secondary | ICD-10-CM | POA: Insufficient documentation

## 2021-02-03 DIAGNOSIS — F3341 Major depressive disorder, recurrent, in partial remission: Secondary | ICD-10-CM

## 2021-02-03 DIAGNOSIS — Z87891 Personal history of nicotine dependence: Secondary | ICD-10-CM | POA: Insufficient documentation

## 2021-02-03 DIAGNOSIS — I509 Heart failure, unspecified: Secondary | ICD-10-CM

## 2021-02-03 MED ORDER — SERTRALINE HCL 50 MG PO TABS: 50.0000 mg | ORAL_TABLET | Freq: Every day | ORAL | 2 refills | Status: DC

## 2021-02-03 MED ORDER — TORSEMIDE 40 MG PO TABS: 40.0000 mg | ORAL_TABLET | Freq: Every day | ORAL | 0 refills | Status: DC

## 2021-02-03 MED ORDER — ISOSORBIDE DINITRATE 20 MG PO TABS: 20.0000 mg | ORAL_TABLET | Freq: Three times a day (TID) | ORAL | 0 refills | Status: DC

## 2021-02-03 MED ORDER — HYDRALAZINE HCL 50 MG PO TABS: 50.0000 mg | ORAL_TABLET | Freq: Three times a day (TID) | ORAL | 0 refills | Status: DC

## 2021-02-03 MED ORDER — TRAZODONE HCL 50 MG PO TABS: 50.0000 mg | ORAL_TABLET | Freq: Every evening | ORAL | 0 refills | Status: DC | PRN

## 2021-02-03 MED ORDER — LEVOTHYROXINE SODIUM 150 MCG PO TABS: 150.0000 ug | ORAL_TABLET | Freq: Every day | ORAL | 0 refills | Status: DC
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
## 2021-02-03 NOTE — Progress Notes (Signed)
Patient has eaten and taken medication today. Patient reports pain in the mouth due to white spots appearing a week ago on his inner cheeks and tongue. Patient reports sleep apnea and needing a CPAP. Patient reports day time drowsiness due to not sleeping properly.

## 2021-02-03 NOTE — Patient Instructions (Signed)
To help with the sores in your mouth, I do encourage you to stop eating hard candy, use a salt water rinse as needed.  We will work on finding resources for you to help with your sleep apnea.  We will call you with today's lab results, you will follow-up with me in 2 weeks.  Roney Jaffe, PA-C Physician Assistant [facility] [email or web address]   Canker Sores Canker sores are small, painful sores that develop inside your mouth. You can get one or more canker sores on the inside of your lips or cheeks, on your tongue, or anywhere inside your mouth. Canker sores cannot be passed from person to person (are not contagious). These sores are different from the sores that you may get on the outside of your lips (cold sores or fever blisters). What are the causes? The cause of this condition is not known. The condition may be passed down from a parent (genetic). What increases the risk? This condition is more likely to develop in: Women. People in their teens or 53s. Women who are having their menstrual period. People who are under a lot of emotional stress. People who do not get enough iron or B vitamins. People who do not take care of their mouth and teeth (have poor oral hygiene). People who have an injury inside the mouth, such as after having dental work or from chewing something hard. What are the signs or symptoms? Canker sores usually start as painful red bumps. Then they turn into small white, yellow, or gray sores that have red borders. The sores may be painful, and the pain may get worse when you eat or drink. Along with the canker sore, symptoms may also include: Fever. Fatigue. Swollen lymph nodes in your neck. How is this diagnosed? This condition may be diagnosed based on your symptoms and an exam of the inside of your mouth. If you get canker sores often or if they are very bad, you may have tests, such as: Blood tests  to rule out possible causes. Swabbing a fluid sample from the sore to be tested for infection. Removing a small tissue sample from the sore (biopsy) to test it for cancer. How is this treated? Most canker sores go away without treatment in about 1 week. Home care is usually the only treatment that you will need. Over-the-counter medicines can relieve discomfort. If you have severe canker sores, your health care provider may prescribe: Numbing ointment to relieve pain. Do not use numbing gels or products containing benzocaine in children who are 1 years of age or younger. Vitamins. Steroid medicines. These may be given as pills, mouth rinses, or gels. Antibiotic mouth rinse. Follow these instructions at home:  Apply, take, or use over-the-counter and prescription medicines only as told by your health care provider. These include vitamins and ointments. If you were prescribed an antibiotic mouth rinse, use it as told by your health care provider. Do not stop using the antibiotic even if your condition improves. Until the sores are healed: Do not drink coffee or citrus juices. Do not eat spicy or salty foods. Use a mild, over-the-counter mouth rinse as recommended by your health care provider. Practice good oral hygiene by: Flossing your teeth every day. Brushing your teeth with a soft toothbrush twice each day. Contact a health care provider if: Your symptoms do not get better after 2 weeks. You also have a fever or swollen glands in your neck. You get canker sores often. You have  a canker sore that is getting larger. You cannot eat or drink due to your canker sores. Summary Canker sores are small, painful sores that develop inside your mouth. Canker sores usually start as painful red bumps that turn into small white, yellow, or gray sores that have red borders. The sores may be quite painful, and the pain may get worse when you eat or drink. Most canker sores clear up without treatment  in about 1 week. Over-the-counter medicines can relieve discomfort. This information is not intended to replace advice given to you by your health care provider. Make sure you discuss any questions you have with your health care provider. Document Revised: 01/12/2020 Document Reviewed: 01/12/2020 Elsevier Patient Education  2022 [employer].

## 2021-02-04 LAB — MAGNESIUM: Magnesium: 3 mg/dL — ABNORMAL HIGH (ref 1.6–2.3)

## 2021-02-04 LAB — THYROID PANEL WITH TSH
Free Thyroxine Index: 1.5 (ref 1.2–4.9)
T3 Uptake Ratio: 26 % (ref 24–39)
T4, Total: 5.6 ug/dL (ref 4.5–12.0)
TSH: 24.9 u[IU]/mL — ABNORMAL HIGH (ref 0.450–4.500)

## 2021-02-11 ENCOUNTER — Telehealth: Payer: Self-pay | Admitting: *Deleted

## 2021-02-11 DIAGNOSIS — G4730 Sleep apnea, unspecified: Secondary | ICD-10-CM

## 2021-02-11 NOTE — Telephone Encounter (Signed)
Order placed per [facility] instead of referral.

## 2021-02-13 ENCOUNTER — Telehealth: Payer: Self-pay | Admitting: *Deleted

## 2021-02-13 NOTE — Telephone Encounter (Signed)
MA UTR patient x2 with VM stating to return a phone call to [facility] at [phone].

## 2021-02-13 NOTE — Telephone Encounter (Signed)
-----   Message from Roney Jaffe, [place] sent at 02/11/2021  8:42 AM EDT ----- Please call patient and let him know that his thyroid is still abnormal, he was recently restarted on this medication, we will recheck it at his next office visit.  Is it very important that he follows up with [place] within the next week.  His magnesium levels were also elevated.  It is very important that he increase his hydration, we will recheck this at his next office visit.

## 2021-02-21 ENCOUNTER — Telehealth: Payer: Self-pay | Admitting: *Deleted

## 2021-02-21 NOTE — Telephone Encounter (Signed)
-----   Message from Cari S Mayers, PA-C sent at 02/11/2021  8:42 AM EDT ----- Please call patient and let him know that his thyroid is still abnormal, he was recently restarted on this medication, we will recheck it at his next office visit.  Is it very important that he follows up with us within the next week.  His magnesium levels were also elevated.  It is very important that he increase his hydration, we will recheck this at his next office visit. 

## 2021-02-21 NOTE — Telephone Encounter (Signed)
Medical Assistant left message on patient's home and cell voicemail. Voicemail states to give a call back to Jayden Kratochvil with [facility] at [phone].  

## 2021-02-24 ENCOUNTER — Ambulatory Visit: Payer: Self-pay | Attending: Physician Assistant | Admitting: Physician Assistant

## 2021-02-24 ENCOUNTER — Other Ambulatory Visit: Payer: Self-pay

## 2021-02-24 VITALS — BP 129/82 | HR 98 | Temp 98.7°F | Resp 18 | Ht 70.0 in | Wt 352.0 lb

## 2021-02-24 DIAGNOSIS — G4730 Sleep apnea, unspecified: Secondary | ICD-10-CM

## 2021-02-24 DIAGNOSIS — E0500 Thyrotoxicosis with diffuse goiter without thyrotoxic crisis or storm: Secondary | ICD-10-CM

## 2021-02-24 DIAGNOSIS — K047 Periapical abscess without sinus: Secondary | ICD-10-CM

## 2021-02-24 DIAGNOSIS — I509 Heart failure, unspecified: Secondary | ICD-10-CM

## 2021-02-24 DIAGNOSIS — I10 Essential (primary) hypertension: Secondary | ICD-10-CM

## 2021-02-24 MED ORDER — PENICILLIN V POTASSIUM 500 MG PO TABS: 500.0000 mg | ORAL_TABLET | Freq: Three times a day (TID) | ORAL | 0 refills | Status: AC

## 2021-02-24 NOTE — Patient Instructions (Signed)
For your dental pain, you are going to take penicillin 3 times a day for the next 10 days.  I strongly encourage you to follow-up with dentistry as soon as you are able.  It is very important that you follow-up with a sleep study to help you with your sleep apnea.  We will call you with today's lab results.  Please make sure that you follow-up with the mobile unit in the next 2 to 4 weeks.  Roney Jaffe, PA-C Physician Assistant [facility] [email or web address]   Dental Abscess A dental abscess is an infection around a tooth that may involve pain, swelling, and a collection of pus, as well as other symptoms. Treatment is important to help with symptoms and to prevent the infection from spreading. The general types of dental abscesses are: Pulpal abscess. This abscess may form from the inner part of the tooth (pulp). Periodontal abscess. This abscess may form from the gum. What are the causes? This condition is caused by a bacterial infection in or around the tooth. It may result from: Severe tooth decay (cavities). Trauma to the tooth, such as a broken or chipped tooth. What increases the risk? This condition is more likely to develop in males. It is also more likely to develop in people who: Have cavities. Have severe gum disease. Eat sugary snacks between meals. Use tobacco products. Have diabetes. Have a weakened disease-fighting system (immune system). Do not brush and care for their teeth regularly. What are the signs or symptoms? Mild symptoms of this condition include: Tenderness. Bad breath. Fever. A bitter taste in the mouth. Pain in and around the infected tooth. Moderate symptoms of this condition include: Swollen neck glands. Chills. Pus drainage. Swelling and redness around the infected tooth, in the mouth, or in the face. Severe pain in and around the infected tooth. Severe symptoms of this condition  include: Difficulty swallowing. Difficulty opening the mouth. Nausea. Vomiting. How is this diagnosed? This condition is diagnosed based on: Your symptoms and your medical and dental history. An examination of the infected tooth. During the exam, your dental care provider may tap on the infected tooth. You may also need to have X-rays taken of the affected area. How is this treated? This condition is treated by getting rid of the infection. This may be done with: Antibiotic medicines. These may be used in certain situations. Antibacterial mouth rinse. Incision and drainage. This procedure is done by making an incision in the abscess to drain out the pus. Removing pus is the first priority in treating an abscess. A root canal. This may be performed to save the tooth. Your dental care provider accesses the visible part of your tooth (crown) with a drill and removes any infected pulp. Then the space is filled and sealed off. Tooth extraction. The tooth is pulled out if it cannot be saved by other treatment. You may also receive treatment for pain, such as: Acetaminophen or NSAIDs. Gels that contain a numbing medicine. An injection to block the pain near your nerve. Follow these instructions at home: Medicines Take over-the-counter and prescription medicines only as told by your dental care provider. If you were prescribed an antibiotic, take it as told by your dental care provider. Do not stop taking the antibiotic even if you start to feel better. If you were prescribed a gel that contains a numbing medicine, use it exactly as told in the directions. Do not use these gels for children who are younger than  46 years of age. Use an antibacterial mouth rinse as told by your dental care provider. General instructions  Gargle with a mixture of salt and water 3-4 times a day or as needed. To make salt water, completely dissolve -1 tsp (3-6 g) of salt in 1 cup (237 mL) of warm water. Eat a soft  diet while your abscess is healing. Drink enough fluid to keep your urine pale yellow. Do not apply heat to the outside of your mouth. Do not use any products that contain nicotine or tobacco. These products include cigarettes, chewing tobacco, and vaping devices, such as e-cigarettes. If you need help quitting, ask your dental care provider. Keep all follow-up visits. This is important. How is this prevented?  Excellent dental home care, which includes brushing your teeth every morning and night with fluoride toothpaste. Floss one time each day. Get regularly scheduled dental cleanings. Consider having a dental sealant applied on teeth that have deep grooves to prevent cavities. Drink fluoridated water regularly. This includes most tap water. Check the label on bottled water to see if it contains fluoride. Reduce or eliminate sugary drinks. Eat healthy meals and snacks. Wear a mouth guard or face shield to protect your teeth while playing sports. Contact a health care provider if: Your pain is worse and is not helped by medicine. You have swelling. You see pus around the tooth. You have a fever or chills. Get help right away if: Your symptoms suddenly get worse. You have a very bad headache. You have problems breathing or swallowing. You have trouble opening your mouth. You have swelling in your neck or around your eye. These symptoms may represent a serious problem that is an emergency. Do not wait to see if the symptoms will go away. Get medical help right away. Call your local emergency services (911 in the [place] Do not drive yourself to the hospital. Summary A dental abscess is a collection of pus in or around a tooth that results from an infection. A dental abscess may result from severe tooth decay, trauma to the tooth, or severe gum disease around a tooth. Symptoms include severe pain, swelling, redness, and drainage of pus in and around the infected tooth. The first priority  in treating a dental abscess is to drain out the pus. Treatment may also involve removing damage inside the tooth (root canal) or extracting the tooth. This information is not intended to replace advice given to you by your health care provider. Make sure you discuss any questions you have with your health care provider. Document Revised: 06/27/2020 Document Reviewed: 06/27/2020 Elsevier Patient Education  2022 [employer].

## 2021-02-24 NOTE — Progress Notes (Signed)
Established Patient Office Visit  Subjective:  Patient ID: Kyle Harrison, male    DOB: 01/14/1981  Age: 40 y.o. MRN: [id number]  CC:  Chief Complaint  Patient presents with   Medication Management   Oral Pain    HPI Kyle Harrison reports that he continues to be treated for substance abuse at [facility] residential treatment center, states that he will be transferring to caring services in [facility] for long-term care, states that he is currently waiting for a bed to open.  Reports that he has been unable to get a sleep study, still is having difficulty sleeping at night due to not having a CPAP, states that he continues to have daytime fatigue as a result.  Reports that he continues to have sores on his tongue, worse on the left side in the back.  Reports that he did stop eating hard candy without relief.  Reports that he has been consistent with his thyroid medication, but has only been on this dosing since January 04, 2021.  Prior to this he was not taking it consistently.    Past Medical History:  Diagnosis Date   Acute on chronic respiratory failure with hypoxia and hypercapnia (HCC) 10/23/2018   CHF (congestive heart failure) (HCC)    Preserved EF   CKD (chronic kidney disease) stage 3, GFR 30-59 ml/min (HCC) 10/23/2018   Diabetes mellitus without complication (HCC)    Hypertension    Hypertension associated with diabetes (HCC) 10/23/2018   Morbid (severe) obesity due to excess calories (HCC) 10/23/2018   Morbid obesity (HCC)    Respiratory failure (HCC) 10/23/2018   Smoker     Past Surgical History:  Procedure Laterality Date   RIGHT HEART CATH N/A 12/15/2018   Procedure: RIGHT HEART CATH;  Surgeon: Laurey Morale, MD;  Location: [facility] INVASIVE CV LAB;  Service: Cardiovascular;  Laterality: N/A;    Family History  Problem Relation Age of Onset   Graves' disease Mother    Hypertension Mother     Social History   Socioeconomic History   Marital status:  Single    Spouse name: Not on file   Number of children: Not on file   Years of education: Not on file   Highest education level: Not on file  Occupational History   Not on file  Tobacco Use   Smoking status: Former    Packs/day: 0.50    Types: Cigarettes   Smokeless tobacco: Never  Substance and Sexual Activity   Alcohol use: No   Drug use: No   Sexual activity: Not Currently  Other Topics Concern   Not on file  Social History Narrative   Not on file   Social Determinants of Health   Financial Resource Strain: Not on file  Food Insecurity: Not on file  Transportation Needs: Not on file  Physical Activity: Not on file  Stress: Not on file  Social Connections: Not on file  Intimate Partner Violence: Not on file    Outpatient Medications Prior to Visit  Medication Sig Dispense Refill   fluticasone (FLONASE) 50 MCG/ACT nasal spray Place 2 sprays into both nostrils daily. 15.8 mL 0   hydrALAZINE (APRESOLINE) 50 MG tablet Take 1 tablet (50 mg total) by mouth 3 (three) times daily. 90 tablet 0   isosorbide dinitrate (ISORDIL) 20 MG tablet Take 1 tablet (20 mg total) by mouth 3 (three) times daily. 90 tablet 0   levothyroxine (SYNTHROID) 150 MCG tablet Take 1 tablet (150 mcg  total) by mouth daily before breakfast. 30 tablet 0   sertraline (ZOLOFT) 50 MG tablet Take 1 tablet (50 mg total) by mouth daily. 30 tablet 2   torsemide 40 MG TABS Take 40 mg by mouth daily. 30 tablet 0   traZODone (DESYREL) 50 MG tablet Take 1 tablet (50 mg total) by mouth at bedtime as needed for sleep. 30 tablet 0   No facility-administered medications prior to visit.    No Known Allergies  ROS Review of Systems  Constitutional:  Positive for fatigue.  HENT:  Positive for dental problem.   Eyes: Negative.   Respiratory:  Negative for shortness of breath and wheezing.   Cardiovascular:  Negative for chest pain, palpitations and leg swelling.  Gastrointestinal: Negative.   Endocrine: Negative.    Genitourinary: Negative.   Musculoskeletal: Negative.   Skin: Negative.   Allergic/Immunologic: Negative.   Neurological: Negative.   Hematological: Negative.   Psychiatric/Behavioral: Negative.       Objective:    Physical Exam Vitals and nursing note reviewed.  Constitutional:      General: He is not in acute distress.    Appearance: Normal appearance. He is obese. He is not ill-appearing.  HENT:     Head: Normocephalic and atraumatic.     Right Ear: External ear normal.     Left Ear: External ear normal.     Nose: Nose normal.     Mouth/Throat:     Mouth: Mucous membranes are moist.     Pharynx: Oropharynx is clear.  Eyes:     Extraocular Movements: Extraocular movements intact.     Conjunctiva/sclera: Conjunctivae normal.     Pupils: Pupils are equal, round, and reactive to light.  Cardiovascular:     Rate and Rhythm: Normal rate and regular rhythm.     Pulses: Normal pulses.     Heart sounds: Normal heart sounds.  Pulmonary:     Effort: Pulmonary effort is normal.     Breath sounds: Normal breath sounds.  Musculoskeletal:        General: Normal range of motion.     Cervical back: Normal range of motion and neck supple.  Skin:    General: Skin is warm and dry.  Neurological:     General: No focal deficit present.     Mental Status: He is alert and oriented to person, place, and time.  Psychiatric:        Mood and Affect: Mood normal.        Behavior: Behavior normal.        Thought Content: Thought content normal.        Judgment: Judgment normal.    BP 129/82 (BP Location: Left Arm, Patient Position: Sitting, Cuff Size: Large)   Pulse 98   Temp 98.7 F (37.1 C) (Oral)   Resp 18   Ht 5\' 10"  (1.778 m)   Wt (!) 352 lb (159.7 kg)   SpO2 (!) 88%   BMI 50.51 kg/m  Wt Readings from Last 3 Encounters:  02/24/21 (!) 352 lb (159.7 kg)  02/03/21 (!) 339 lb (153.8 kg)  12/15/18 (!) 353 lb (160.1 kg)     Health Maintenance Due  Topic Date Due   COVID-19  Vaccine (1) Never done   Pneumococcal Vaccine 86-89 Years old (1 - PCV) Never done   FOOT EXAM  Never done   OPHTHALMOLOGY EXAM  Never done   Hepatitis C Screening  Never done   HEMOGLOBIN A1C  04/26/2019  INFLUENZA VACCINE  Never done    There are no preventive care reminders to display for this patient.  Lab Results  Component Value Date   TSH 17.400 (H) 02/24/2021   Lab Results  Component Value Date   WBC 7.0 12/07/2018   HGB 14.6 12/15/2018   HGB 14.6 12/15/2018   HCT 43.0 12/15/2018   HCT 43.0 12/15/2018   MCV 93.4 12/07/2018   PLT 192 12/07/2018   Lab Results  Component Value Date   NA 140 12/15/2018   NA 140 12/15/2018   K 3.8 12/15/2018   K 3.8 12/15/2018   CO2 32 12/15/2018   GLUCOSE 85 12/15/2018   BUN 15 12/15/2018   CREATININE 1.47 (H) 12/15/2018   BILITOT 1.0 10/23/2018   ALKPHOS 43 10/23/2018   AST 41 10/23/2018   ALT 17 10/23/2018   PROT 7.5 10/23/2018   ALBUMIN 3.4 (L) 10/23/2018   CALCIUM 9.4 12/15/2018   ANIONGAP 9 12/15/2018   No results found for: CHOL No results found for: HDL No results found for: LDLCALC No results found for: TRIG No results found for: CHOLHDL Lab Results  Component Value Date   HGBA1C 6.2 (H) 10/25/2018      Assessment & Plan:   Problem List Items Addressed This Visit       Cardiovascular and Mediastinum   Essential hypertension - Primary   Chronic congestive heart failure (HCC)     Respiratory   Sleep apnea     Endocrine   Graves disease   Relevant Orders   Thyroid Panel With TSH (Completed)     Other   Morbid (severe) obesity due to excess calories (HCC) (Chronic)   Hypomagnesemia   Relevant Orders   Magnesium (Completed)   Other Visit Diagnoses     Dental abscess       Relevant Medications   penicillin v potassium (VEETID) 500 MG tablet       Meds ordered this encounter  Medications   penicillin v potassium (VEETID) 500 MG tablet    Sig: Take 1 tablet (500 mg total) by mouth 3  (three) times daily for 10 days.    Dispense:  30 tablet    Refill:  0    Order Specific Question:   Supervising Provider    Answer:   Delford Field, PATRICK E [[id number]]   1. Essential hypertension Continue current regimen.  Patient encouraged to follow-up with this provider in 4 weeks.  Patient understands and agrees.  Red flags given for prompt reevaluation  2. Graves disease Repeat thyroid panel, consider increase if necessary - Thyroid Panel With TSH  3. Dental abscess Trial Pen-Vee, patient encouraged to follow-up with dentistry soon as possible - penicillin v potassium (VEETID) 500 MG tablet; Take 1 tablet (500 mg total) by mouth 3 (three) times daily for 10 days.  Dispense: 30 tablet; Refill: 0  4. Hypomagnesemia  - Magnesium  5. Sleep apnea, unspecified type Patient encouraged to follow-up with referral for sleep study once he is at caring services.  Patient understands and agrees  6. Chronic congestive heart failure, unspecified heart failure type (HCC) Continue current regimen  7. Morbid (severe) obesity due to excess calories (HCC)   I have reviewed the patient's medical history (PMH, PSH, Social History, Family History, Medications, and allergies) , and have been updated if relevant. I spent 30 minutes reviewing chart and  face to face time with patient.     Follow-up: Return in about 4 weeks (around 03/24/2021).  Loraine Grip Mayers, PA-C

## 2021-02-25 ENCOUNTER — Encounter: Payer: Self-pay | Admitting: Physician Assistant

## 2021-02-25 LAB — THYROID PANEL WITH TSH
Free Thyroxine Index: 1.3 (ref 1.2–4.9)
T3 Uptake Ratio: 25 % (ref 24–39)
T4, Total: 5.2 ug/dL (ref 4.5–12.0)
TSH: 17.4 u[IU]/mL — ABNORMAL HIGH (ref 0.450–4.500)

## 2021-02-25 LAB — MAGNESIUM: Magnesium: 2 mg/dL (ref 1.6–2.3)

## 2021-02-25 MED ORDER — LEVOTHYROXINE SODIUM 200 MCG PO TABS: 200.0000 ug | ORAL_TABLET | Freq: Every day | ORAL | 1 refills | Status: DC

## 2021-02-25 NOTE — Addendum Note (Signed)
Addended by: Roney Jaffe on: 02/25/2021 01:08 PM   Modules accepted: Orders

## 2021-02-28 NOTE — Progress Notes (Signed)
Patient is aware of results per [facility] Nurse Ms Phylis.

## 2021-05-14 IMAGING — DX PORTABLE CHEST - 1 VIEW
1 series · 1 of 1 positions shown · non-contrast
Comparison: 06/13/2018 and prior radiographs

CLINICAL DATA: Acute shortness of breath

EXAM:
PORTABLE CHEST 1 VIEW

[chest ap]
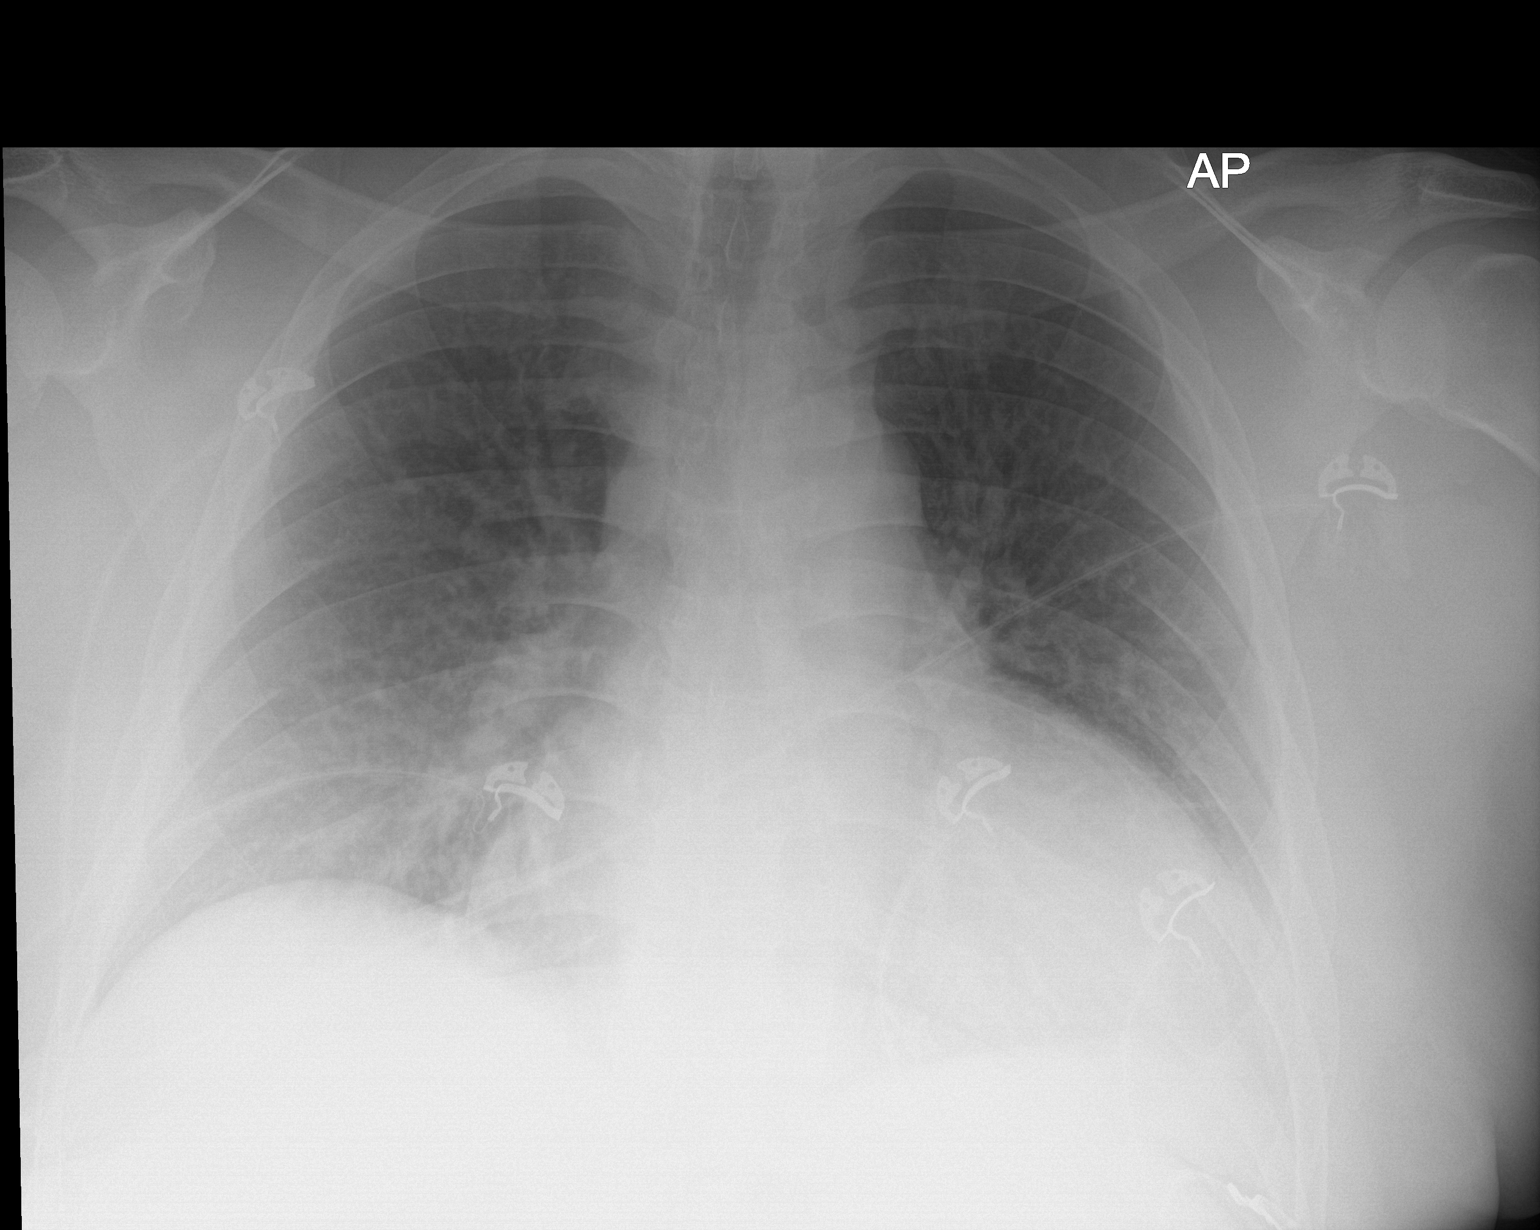

[1 of 1 positions shown; findings below may reference images not displayed]

FINDINGS: Cardiomegaly and pulmonary vascular congestion noted.

There is no evidence of focal airspace disease, pulmonary edema,
suspicious pulmonary nodule/mass, pleural effusion, or pneumothorax.

No acute bony abnormalities are identified.
IMPRESSION: Cardiomegaly with pulmonary vascular congestion.

## 2021-05-14 IMAGING — DX PORTABLE CHEST - 1 VIEW
1 series · 1 of 1 positions shown · non-contrast
Comparison: 10/23/2018, 06/13/2018

CLINICAL DATA: Intubated

EXAM:
PORTABLE CHEST 1 VIEW

[chest ap]
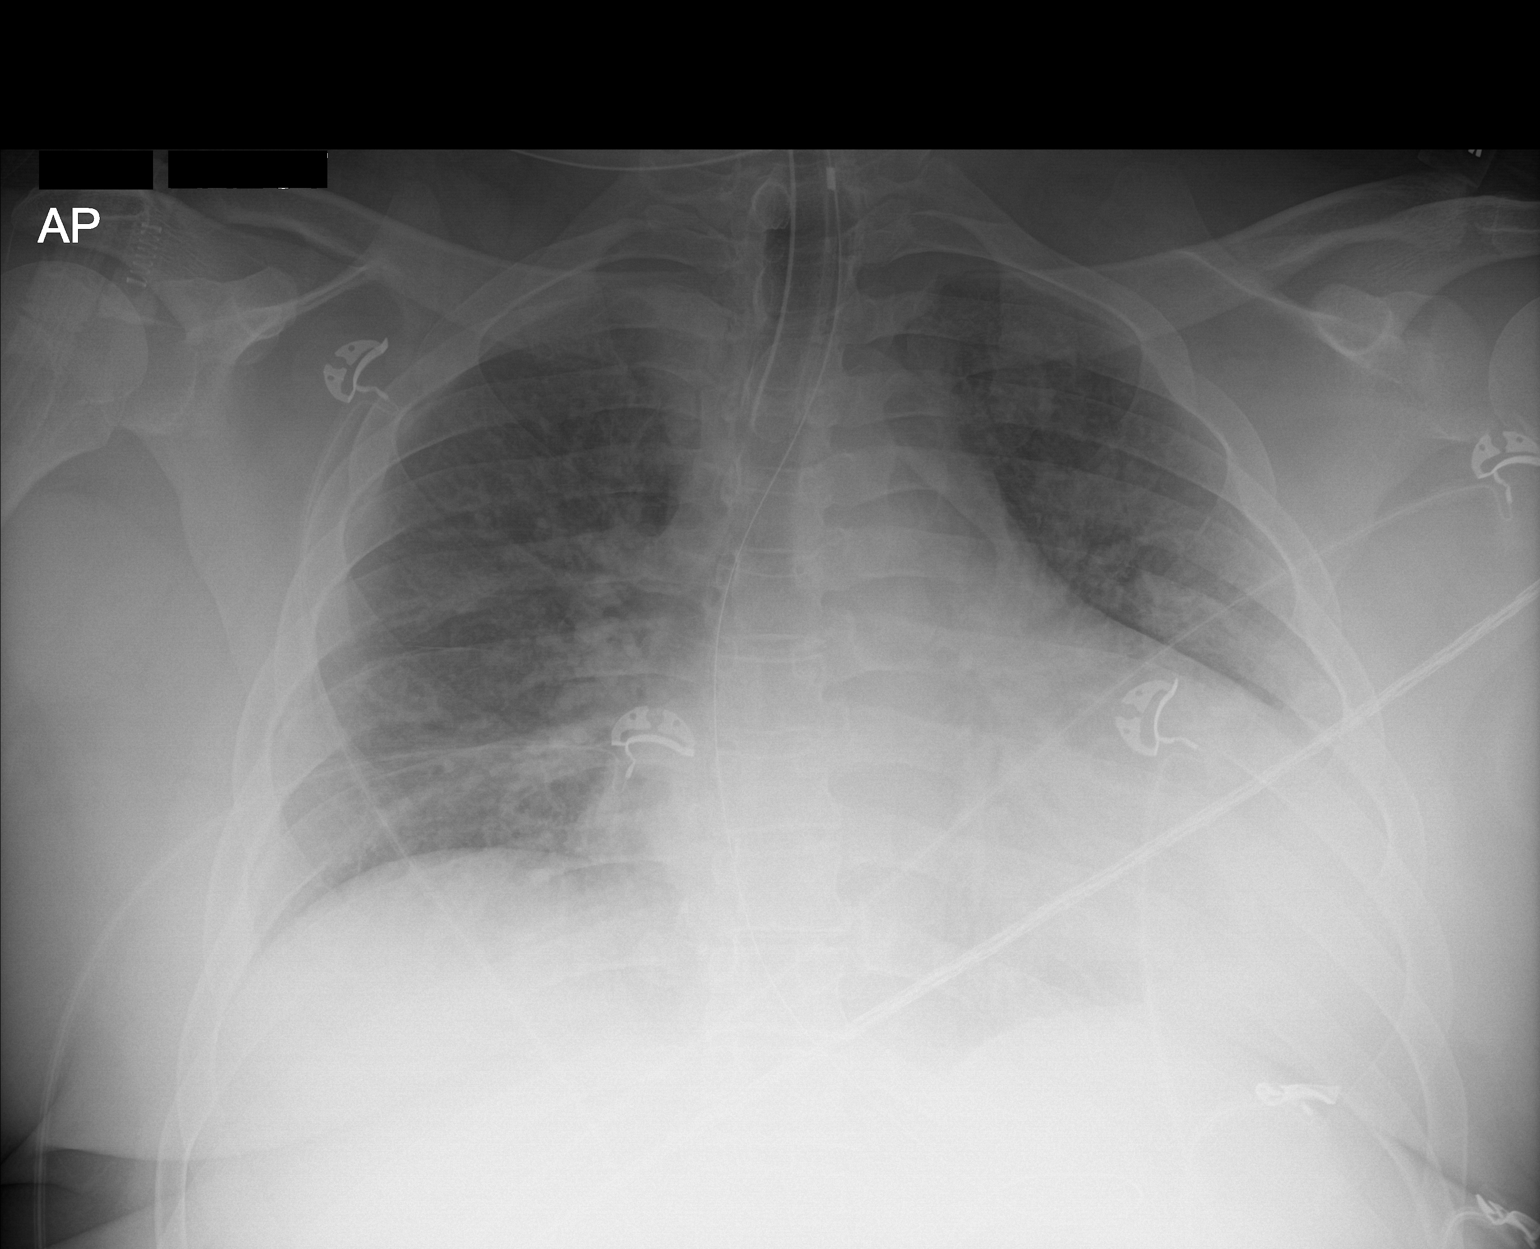

[1 of 1 positions shown; findings below may reference images not displayed]

FINDINGS: Interval intubation, tip of the endotracheal tube is about 3.7 cm
superior to the carina. Esophageal tube tip below the diaphragm but
non included. Cardiomegaly with vascular congestion and bilateral
interstitial and ground-glass opacity, suspect for mild edema.
IMPRESSION: 1. Endotracheal tube tip about 3.7 cm superior to carina. Esophageal
tube tip below the diaphragm but non included
2. Cardiomegaly with vascular congestion and mild diffuse bilateral
interstitial and ground-glass opacities suspect for edema

## 2021-05-15 IMAGING — DX PORTABLE CHEST - 1 VIEW
1 series · 1 of 1 positions shown · non-contrast
Comparison: 10/23/2018.

CLINICAL DATA: Intubation.

EXAM:
PORTABLE CHEST 1 VIEW

[chest ap]
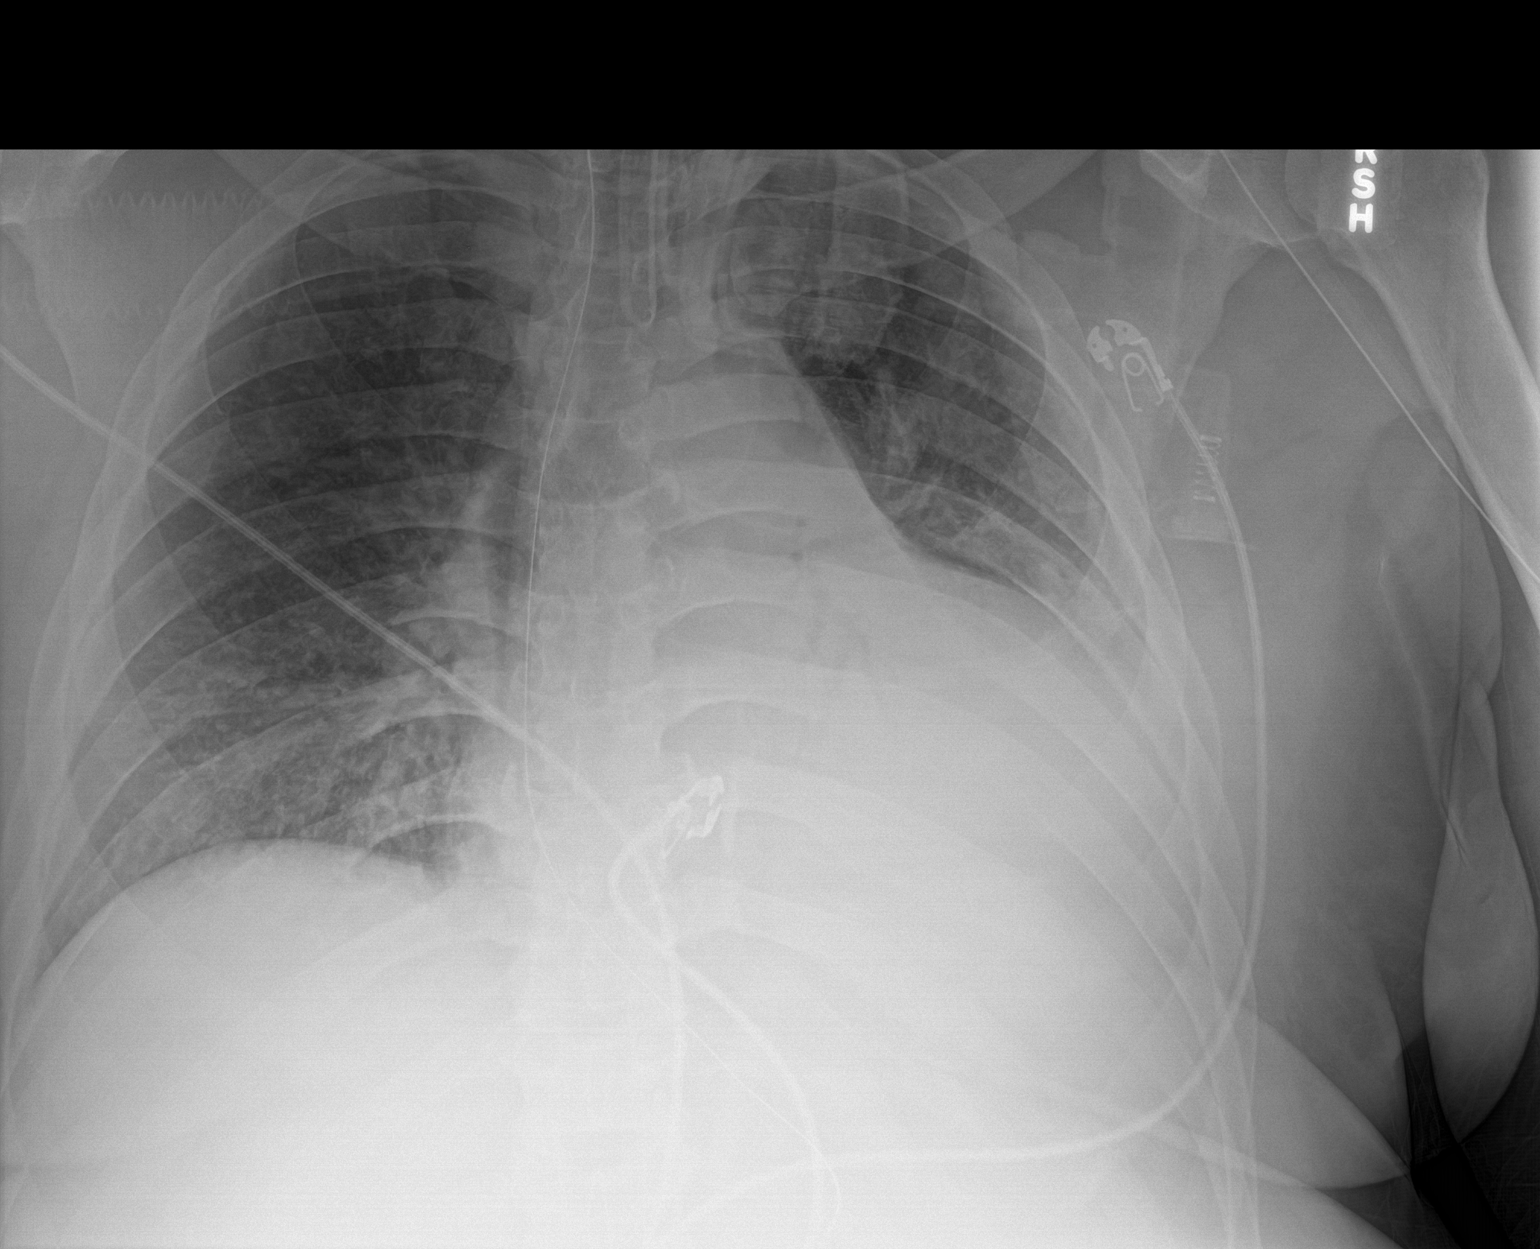

[1 of 1 positions shown; findings below may reference images not displayed]

FINDINGS: Endotracheal tube, NG tube in stable position. Stable cardiomegaly.
Dense consolidation/infiltrate left lung base. This is worsened from
prior exam. Mild infiltrate right lung base also noted on today's
exam. Probable left pleural effusion. No pneumothorax. Degenerative
change thoracic spine.
IMPRESSION: 1.  Endotracheal tube and NG tube in stable position.

2.  Stable cardiomegaly.

3. Dense consolidation/infiltrate left lung base. This has worsened
from prior exam. Left pleural effusion may also be present. Mild
right base infiltrate also noted on today's exam.

## 2021-09-22 ENCOUNTER — Ambulatory Visit: Payer: Self-pay | Admitting: Physician Assistant

## 2021-09-22 ENCOUNTER — Telehealth: Payer: Self-pay | Admitting: Physician Assistant

## 2021-09-22 VITALS — BP 126/78 | HR 67 | Temp 98.0°F | Resp 18

## 2021-09-22 DIAGNOSIS — E0500 Thyrotoxicosis with diffuse goiter without thyrotoxic crisis or storm: Secondary | ICD-10-CM

## 2021-09-22 DIAGNOSIS — F1010 Alcohol abuse, uncomplicated: Secondary | ICD-10-CM

## 2021-09-22 DIAGNOSIS — F3341 Major depressive disorder, recurrent, in partial remission: Secondary | ICD-10-CM

## 2021-09-22 DIAGNOSIS — I10 Essential (primary) hypertension: Secondary | ICD-10-CM

## 2021-09-22 DIAGNOSIS — E559 Vitamin D deficiency, unspecified: Secondary | ICD-10-CM

## 2021-09-22 DIAGNOSIS — Z1159 Encounter for screening for other viral diseases: Secondary | ICD-10-CM

## 2021-09-22 DIAGNOSIS — N1831 Chronic kidney disease, stage 3a: Secondary | ICD-10-CM

## 2021-09-22 DIAGNOSIS — G4730 Sleep apnea, unspecified: Secondary | ICD-10-CM

## 2021-09-22 DIAGNOSIS — I509 Heart failure, unspecified: Secondary | ICD-10-CM

## 2021-09-22 DIAGNOSIS — F1410 Cocaine abuse, uncomplicated: Secondary | ICD-10-CM

## 2021-09-22 DIAGNOSIS — F3340 Major depressive disorder, recurrent, in remission, unspecified: Secondary | ICD-10-CM

## 2021-09-22 MED ORDER — POTASSIUM CHLORIDE CRYS ER 20 MEQ PO TBCR: 20.0000 meq | EXTENDED_RELEASE_TABLET | Freq: Every day | ORAL | 1 refills | Status: DC

## 2021-09-22 MED ORDER — SERTRALINE HCL 50 MG PO TABS: 50.0000 mg | ORAL_TABLET | Freq: Every day | ORAL | 2 refills | Status: DC

## 2021-09-22 MED ORDER — HYDRALAZINE HCL 50 MG PO TABS: 50.0000 mg | ORAL_TABLET | Freq: Three times a day (TID) | ORAL | 1 refills | Status: DC

## 2021-09-22 MED ORDER — TORSEMIDE 40 MG PO TABS: 40.0000 mg | ORAL_TABLET | Freq: Every day | ORAL | 0 refills | Status: DC

## 2021-09-22 MED ORDER — TRAZODONE HCL 50 MG PO TABS: 50.0000 mg | ORAL_TABLET | Freq: Every evening | ORAL | 1 refills | Status: DC | PRN

## 2021-09-22 MED ORDER — TRAZODONE HCL 50 MG PO TABS: 50.0000 mg | ORAL_TABLET | Freq: Every evening | ORAL | 0 refills | Status: DC | PRN

## 2021-09-22 MED ORDER — LEVOTHYROXINE SODIUM 150 MCG PO TABS: 150.0000 ug | ORAL_TABLET | Freq: Every day | ORAL | 1 refills | Status: DC

## 2021-09-22 MED ORDER — TORSEMIDE 40 MG PO TABS: 40.0000 mg | ORAL_TABLET | Freq: Every day | ORAL | 1 refills | Status: DC

## 2021-09-22 NOTE — Patient Instructions (Signed)
Keep working on the weight loss, I do encourage you to eliminate any sugary drinks, I also encourage you to follow a low-sodium diet.  We will send off the order for you to get a new mask for your CPAP machine.  We will call you with today's lab results.  Roney Jaffe, PA-C Physician Assistant [facility] [email or web address]   Low-Sodium Eating Plan Sodium, which is an element that makes up salt, helps you maintain a healthy balance of fluids in your body. Too much sodium can increase your blood pressure and cause fluid and waste to be held in your body. Your health care provider or dietitian may recommend following this plan if you have high blood pressure (hypertension), kidney disease, liver disease, or heart failure. Eating less sodium can help lower your blood pressure, reduce swelling, and protect your heart, liver, and kidneys. What are tips for following this plan? Reading food labels The Nutrition Facts label lists the amount of sodium in one serving of the food. If you eat more than one serving, you must multiply the listed amount of sodium by the number of servings. Choose foods with less than 140 mg of sodium per serving. Avoid foods with 300 mg of sodium or more per serving. Shopping  Look for lower-sodium products, often labeled as "low-sodium" or "no salt added." Always check the sodium content, even if foods are labeled as "unsalted" or "no salt added." Buy fresh foods. Avoid canned foods and pre-made or frozen meals. Avoid canned, cured, or processed meats. Buy breads that have less than 80 mg of sodium per slice. Cooking  Eat more home-cooked food and less restaurant, buffet, and fast food. Avoid adding salt when cooking. Use salt-free seasonings or herbs instead of table salt or sea salt. Check with your health care provider or pharmacist before using salt substitutes. Cook with plant-based oils, such as  canola, sunflower, or olive oil. Meal planning When eating at a restaurant, ask that your food be prepared with less salt or no salt, if possible. Avoid dishes labeled as brined, pickled, cured, smoked, or made with soy sauce, miso, or teriyaki sauce. Avoid foods that contain MSG (monosodium glutamate). MSG is sometimes added to [place] food, bouillon, and some canned foods. Make meals that can be grilled, baked, poached, roasted, or steamed. These are generally made with less sodium. General information Most people on this plan should limit their sodium intake to 1,500-2,000 mg (milligrams) of sodium each day. What foods should I eat? Fruits Fresh, frozen, or canned fruit. Fruit juice. Vegetables Fresh or frozen vegetables. "No salt added" canned vegetables. "No salt added" tomato sauce and paste. Low-sodium or reduced-sodium tomato and vegetable juice. Grains Low-sodium cereals, including oats, puffed wheat and rice, and shredded wheat. Low-sodium crackers. Unsalted rice. Unsalted pasta. Low-sodium bread. Whole-grain breads and whole-grain pasta. Meats and other proteins Fresh or frozen (no salt added) meat, poultry, seafood, and fish. Low-sodium canned tuna and salmon. Unsalted nuts. Dried peas, beans, and lentils without added salt. Unsalted canned beans. Eggs. Unsalted nut butters. Dairy Milk. Soy milk. Cheese that is naturally low in sodium, such as ricotta cheese, fresh mozzarella, or Swiss cheese. Low-sodium or reduced-sodium cheese. Cream cheese. Yogurt. Seasonings and condiments Fresh and dried herbs and spices. Salt-free seasonings. Low-sodium mustard and ketchup. Sodium-free salad dressing. Sodium-free light mayonnaise. Fresh or refrigerated horseradish. Lemon juice. Vinegar. Other foods Homemade, reduced-sodium, or low-sodium soups. Unsalted popcorn and pretzels. Low-salt or salt-free chips. The items listed above may not  be a complete list of foods and beverages you can eat.  Contact a dietitian for more information. What foods should I avoid? Vegetables Sauerkraut, pickled vegetables, and relishes. Olives. [place] fries. Onion rings. Regular canned vegetables (not low-sodium or reduced-sodium). Regular canned tomato sauce and paste (not low-sodium or reduced-sodium). Regular tomato and vegetable juice (not low-sodium or reduced-sodium). Frozen vegetables in sauces. Grains Instant hot cereals. Bread stuffing, pancake, and biscuit mixes. Croutons. Seasoned rice or pasta mixes. Noodle soup cups. Boxed or frozen macaroni and cheese. Regular salted crackers. Self-rising flour. Meats and other proteins Meat or fish that is salted, canned, smoked, spiced, or pickled. Precooked or cured meat, such as sausages or meat loaves. Tomasa Blase. Ham. Pepperoni. Hot dogs. Corned beef. Chipped beef. Salt pork. Jerky. Pickled herring. Anchovies and sardines. Regular canned tuna. Salted nuts. Dairy Processed cheese and cheese spreads. Hard cheeses. Cheese curds. Blue cheese. Feta cheese. String cheese. Regular cottage cheese. Buttermilk. Canned milk. Fats and oils Salted butter. Regular margarine. Ghee. Bacon fat. Seasonings and condiments Onion salt, garlic salt, seasoned salt, table salt, and sea salt. Canned and packaged gravies. Worcestershire sauce. Tartar sauce. Barbecue sauce. Teriyaki sauce. Soy sauce, including reduced-sodium. Steak sauce. Fish sauce. Oyster sauce. Cocktail sauce. Horseradish that you find on the [place]. Regular ketchup and mustard. Meat flavorings and tenderizers. Bouillon cubes. Hot sauce. Pre-made or packaged marinades. Pre-made or packaged taco seasonings. Relishes. Regular salad dressings. Salsa. Other foods Salted popcorn and pretzels. Corn chips and puffs. Potato and tortilla chips. Canned or dried soups. Pizza. Frozen entrees and pot pies. The items listed above may not be a complete list of foods and beverages you should avoid. Contact a dietitian for more  information. Summary Eating less sodium can help lower your blood pressure, reduce swelling, and protect your heart, liver, and kidneys. Most people on this plan should limit their sodium intake to 1,500-2,000 mg (milligrams) of sodium each day. Canned, boxed, and frozen foods are high in sodium. Restaurant foods, fast foods, and pizza are also very high in sodium. You also get sodium by adding salt to food. Try to cook at home, eat more fresh fruits and vegetables, and eat less fast food and canned, processed, or prepared foods. This information is not intended to replace advice given to you by your health care provider. Make sure you discuss any questions you have with your health care provider. Document Revised: 05/26/2019 Document Reviewed: 03/22/2019 Elsevier Patient Education  2023 [employer].

## 2021-09-22 NOTE — Telephone Encounter (Signed)
Fionna calling from [employer] is calling to request Torsemide 20 MG TABS because the 40mg  is expensive . Medication is over $200. Please advise [phone]

## 2021-09-22 NOTE — Progress Notes (Unsigned)
   Established Patient Office Visit  Subjective   Patient ID: Kyle Harrison, male    DOB: 1980-12-01  Age: 41 y.o. MRN: [id number]  No chief complaint on file.   [facility] - 3 weeks - LTC - [place] - 90 day program    {History (Optional):23778}  ROS    Objective:     There were no vitals taken for this visit. {Vitals History (Optional):23777}  Physical Exam   No results found for any visits on 09/22/21.  {Labs (Optional):23779}  The ASCVD Risk score (Arnett DK, et al., 2019) failed to calculate for the following reasons:   Cannot find a previous HDL lab   Cannot find a previous total cholesterol lab    Assessment & Plan:   Problem List Items Addressed This Visit   None   No follow-ups on file.    Loraine Grip Mayers, PA-C

## 2021-09-23 ENCOUNTER — Encounter: Payer: Self-pay | Admitting: Physician Assistant

## 2021-09-23 LAB — VITAMIN D 25 HYDROXY (VIT D DEFICIENCY, FRACTURES): Vit D, 25-Hydroxy: 10.6 ng/mL — ABNORMAL LOW (ref 30.0–100.0)

## 2021-09-23 LAB — COMP. METABOLIC PANEL (12)
AST: 42 IU/L — ABNORMAL HIGH (ref 0–40)
Albumin/Globulin Ratio: 1.5 (ref 1.2–2.2)
Albumin: 4.6 g/dL (ref 4.0–5.0)
Alkaline Phosphatase: 67 IU/L (ref 44–121)
BUN/Creatinine Ratio: 21 — ABNORMAL HIGH (ref 9–20)
BUN: 23 mg/dL (ref 6–24)
Bilirubin Total: 0.4 mg/dL (ref 0.0–1.2)
Calcium: 10.1 mg/dL (ref 8.7–10.2)
Chloride: 98 mmol/L (ref 96–106)
Creatinine, Ser: 1.08 mg/dL (ref 0.76–1.27)
Globulin, Total: 3 g/dL (ref 1.5–4.5)
Glucose: 103 mg/dL — ABNORMAL HIGH (ref 70–99)
Potassium: 4 mmol/L (ref 3.5–5.2)
Sodium: 139 mmol/L (ref 134–144)
Total Protein: 7.6 g/dL (ref 6.0–8.5)
eGFR: 89 mL/min/{1.73_m2} (ref >59)

## 2021-09-23 LAB — THYROID PANEL WITH TSH
Free Thyroxine Index: 1.7 (ref 1.2–4.9)
T3 Uptake Ratio: 26 % (ref 24–39)
T4, Total: 6.4 ug/dL (ref 4.5–12.0)
TSH: 21.2 u[IU]/mL — ABNORMAL HIGH (ref 0.450–4.500)

## 2021-09-23 LAB — HCV AB W REFLEX TO QUANT PCR: HCV Ab: NONREACTIVE

## 2021-09-23 LAB — HCV INTERPRETATION

## 2021-09-23 MED ORDER — LEVOTHYROXINE SODIUM 200 MCG PO TABS: 200.0000 ug | ORAL_TABLET | Freq: Every day | ORAL | 1 refills | Status: DC

## 2021-09-23 MED ORDER — VITAMIN D (ERGOCALCIFEROL) 1.25 MG (50000 UNIT) PO CAPS: 50000.0000 [IU] | ORAL_CAPSULE | ORAL | 2 refills | Status: DC

## 2021-09-23 NOTE — Addendum Note (Signed)
Addended by: Roney Jaffe on: 09/23/2021 11:22 AM   Modules accepted: Orders

## 2021-09-24 NOTE — Telephone Encounter (Signed)
-----   Message from Roney Jaffe, [place] sent at 09/23/2021 11:21 AM EDT ----- Please call patient and let him know that his thyroid function is still abnormal, his medication will be increased, he needs to have a follow-up in 6 weeks for further evaluation.  I strongly recommend that he follow-up with endocrinology as well.  He is transferring to long-term care in [place].  I will start a referral for him to be seen by endocrinology here, however he may have to schedule an appointment in [place].  New prescription sent to his pharmacy.  His vitamin D levels are very low, he needs to take 50,000 units once a week for the next 12 weeks and have it rechecked at that time.  Prescription sent to his pharmacy  His kidney function and liver function are within normal limits, his screening for hepatitis C was negative.

## 2022-03-30 ENCOUNTER — Ambulatory Visit: Payer: Self-pay | Admitting: Physician Assistant

## 2022-03-30 VITALS — BP 137/83 | HR 102 | Ht 71.0 in | Wt 380.0 lb

## 2022-03-30 DIAGNOSIS — E0500 Thyrotoxicosis with diffuse goiter without thyrotoxic crisis or storm: Secondary | ICD-10-CM

## 2022-03-30 DIAGNOSIS — R748 Abnormal levels of other serum enzymes: Secondary | ICD-10-CM

## 2022-03-30 DIAGNOSIS — F1010 Alcohol abuse, uncomplicated: Secondary | ICD-10-CM

## 2022-03-30 DIAGNOSIS — G4730 Sleep apnea, unspecified: Secondary | ICD-10-CM

## 2022-03-30 DIAGNOSIS — I872 Venous insufficiency (chronic) (peripheral): Secondary | ICD-10-CM

## 2022-03-30 DIAGNOSIS — I509 Heart failure, unspecified: Secondary | ICD-10-CM

## 2022-03-30 DIAGNOSIS — E559 Vitamin D deficiency, unspecified: Secondary | ICD-10-CM

## 2022-03-30 DIAGNOSIS — F3340 Major depressive disorder, recurrent, in remission, unspecified: Secondary | ICD-10-CM

## 2022-03-30 DIAGNOSIS — Z6841 Body Mass Index (BMI) 40.0 and over, adult: Secondary | ICD-10-CM

## 2022-03-30 DIAGNOSIS — I10 Essential (primary) hypertension: Secondary | ICD-10-CM

## 2022-03-30 DIAGNOSIS — F1410 Cocaine abuse, uncomplicated: Secondary | ICD-10-CM

## 2022-03-30 MED ORDER — LEVOTHYROXINE SODIUM 200 MCG PO TABS: 200.0000 ug | ORAL_TABLET | Freq: Every day | ORAL | 1 refills | Status: DC

## 2022-03-30 MED ORDER — POTASSIUM CHLORIDE CRYS ER 20 MEQ PO TBCR: 20.0000 meq | EXTENDED_RELEASE_TABLET | Freq: Every day | ORAL | 1 refills | Status: AC

## 2022-03-30 MED ORDER — SERTRALINE HCL 50 MG PO TABS: 50.0000 mg | ORAL_TABLET | Freq: Every day | ORAL | 2 refills | Status: AC

## 2022-03-30 MED ORDER — TORSEMIDE 20 MG PO TABS: 20.0000 mg | ORAL_TABLET | Freq: Every day | ORAL | 1 refills | Status: DC

## 2022-03-30 MED ORDER — HYDRALAZINE HCL 50 MG PO TABS: 50.0000 mg | ORAL_TABLET | Freq: Three times a day (TID) | ORAL | 1 refills | Status: DC

## 2022-03-30 NOTE — Progress Notes (Signed)
   Established Patient Office Visit  Subjective   Patient ID: Kyle Harrison, male    DOB: 1981/03/08  Age: 41 y.o. MRN: [id number]  Chief Complaint  Patient presents with  . Medication Management    [facility] - [facility] in [place] leaving Thursday   In [place] was begin trated for thyroid - tkingthemeds for at lest 6 weeks   Needs cpap   {History (Optional):23778}  ROS    Objective:     BP 137/83 (BP Location: Left Arm, Patient Position: Sitting, Cuff Size: Large)   Pulse (!) 102   Ht 5\' 11"  (1.803 m)   Wt (!) 380 lb (172.4 kg)   SpO2 92%   BMI 53.00 kg/m  {Vitals History (Optional):23777}  Physical Exam   No results found for any visits on 03/30/22.  {Labs (Optional):23779}  The ASCVD Risk score (Arnett DK, et al., 2019) failed to calculate for the following reasons:   Cannot find a previous HDL lab   Cannot find a previous total cholesterol lab    Assessment & Plan:   Problem List Items Addressed This Visit       Cardiovascular and Mediastinum   Essential hypertension   Chronic congestive heart failure (HCC)     Respiratory   Sleep apnea - Primary     Endocrine   Graves disease     Other   Alcohol abuse   Cocaine abuse (HCC)   Other Visit Diagnoses     Recurrent major depressive disorder, in remission (HCC)       Venous (peripheral) insufficiency           No follow-ups on file.    2020 Mayers, PA-C

## 2022-03-30 NOTE — Patient Instructions (Signed)
I encourage you to follow-up with the mobile unit after you have settled at your sober living home.  We will work on helping you establish at [facility] and wellness center as well as apply for [employer] health financial assistance.  We will call you with today's lab results.  Kyle Jaffe, PA-C Physician Assistant [facility] Mobile Medicine [email or web address]   Health Maintenance, Male Adopting a healthy lifestyle and getting preventive care are important in promoting health and wellness. Ask your health care provider about: The right schedule for you to have regular tests and exams. Things you can do on your own to prevent diseases and keep yourself healthy. What should I know about diet, weight, and exercise? Eat a healthy diet  Eat a diet that includes plenty of vegetables, fruits, low-fat dairy products, and lean protein. Do not eat a lot of foods that are high in solid fats, added sugars, or sodium. Maintain a healthy weight Body mass index (BMI) is a measurement that can be used to identify possible weight problems. It estimates body fat based on height and weight. Your health care provider can help determine your BMI and help you achieve or maintain a healthy weight. Get regular exercise Get regular exercise. This is one of the most important things you can do for your health. Most adults should: Exercise for at least 150 minutes each week. The exercise should increase your heart rate and make you sweat (moderate-intensity exercise). Do strengthening exercises at least twice a week. This is in addition to the moderate-intensity exercise. Spend less time sitting. Even light physical activity can be beneficial. Watch cholesterol and blood lipids Have your blood tested for lipids and cholesterol at 41 years of age, then have this test every 5 years. You may need to have your cholesterol levels checked more often if: Your lipid or  cholesterol levels are high. You are older than 41 years of age. You are at high risk for heart disease. What should I know about cancer screening? Many types of cancers can be detected early and may often be prevented. Depending on your health history and family history, you may need to have cancer screening at various ages. This may include screening for: Colorectal cancer. Prostate cancer. Skin cancer. Lung cancer. What should I know about heart disease, diabetes, and high blood pressure? Blood pressure and heart disease High blood pressure causes heart disease and increases the risk of stroke. This is more likely to develop in people who have high blood pressure readings or are overweight. Talk with your health care provider about your target blood pressure readings. Have your blood pressure checked: Every 3-5 years if you are 18-71 years of age. Every year if you are 8 years old or older. If you are between the ages of 79 and 37 and are a current or former smoker, ask your health care provider if you should have a one-time screening for abdominal aortic aneurysm (AAA). Diabetes Have regular diabetes screenings. This checks your fasting blood sugar level. Have the screening done: Once every three years after age 24 if you are at a normal weight and have a low risk for diabetes. More often and at a younger age if you are overweight or have a high risk for diabetes. What should I know about preventing infection? Hepatitis B If you have a higher risk for hepatitis B, you should be screened for this virus. Talk with your health care provider to find out if you are at risk  for hepatitis B infection. Hepatitis C Blood testing is recommended for: Everyone born from [birth year] through 1965. Anyone with known risk factors for hepatitis C. Sexually transmitted infections (STIs) You should be screened each year for STIs, including gonorrhea and chlamydia, if: You are sexually active and are younger  than 41 years of age. You are older than 41 years of age and your health care provider tells you that you are at risk for this type of infection. Your sexual activity has changed since you were last screened, and you are at increased risk for chlamydia or gonorrhea. Ask your health care provider if you are at risk. Ask your health care provider about whether you are at high risk for HIV. Your health care provider may recommend a prescription medicine to help prevent HIV infection. If you choose to take medicine to prevent HIV, you should first get tested for HIV. You should then be tested every 3 months for as long as you are taking the medicine. Follow these instructions at home: Alcohol use Do not drink alcohol if your health care provider tells you not to drink. If you drink alcohol: Limit how much you have to 0-2 drinks a day. Know how much alcohol is in your drink. In the [place] one drink equals one 12 oz bottle of beer (355 mL), one 5 oz glass of wine (148 mL), or one 1 oz glass of hard liquor (44 mL). Lifestyle Do not use any products that contain nicotine or tobacco. These products include cigarettes, chewing tobacco, and vaping devices, such as e-cigarettes. If you need help quitting, ask your health care provider. Do not use street drugs. Do not share needles. Ask your health care provider for help if you need support or information about quitting drugs. General instructions Schedule regular health, dental, and eye exams. Stay current with your vaccines. Tell your health care provider if: You often feel depressed. You have ever been abused or do not feel safe at home. Summary Adopting a healthy lifestyle and getting preventive care are important in promoting health and wellness. Follow your health care provider's instructions about healthy diet, exercising, and getting tested or screened for diseases. Follow your health care provider's instructions on monitoring your cholesterol and  blood pressure. This information is not intended to replace advice given to you by your health care provider. Make sure you discuss any questions you have with your health care provider. Document Revised: 09/09/2020 Document Reviewed: 09/09/2020 Elsevier Patient Education  2023 [employer].

## 2022-03-31 ENCOUNTER — Other Ambulatory Visit: Payer: Self-pay | Admitting: Physician Assistant

## 2022-03-31 ENCOUNTER — Encounter: Payer: Self-pay | Admitting: Physician Assistant

## 2022-03-31 DIAGNOSIS — E0500 Thyrotoxicosis with diffuse goiter without thyrotoxic crisis or storm: Secondary | ICD-10-CM

## 2022-03-31 LAB — COMPREHENSIVE METABOLIC PANEL
ALT: 127 IU/L — ABNORMAL HIGH (ref 0–44)
AST: 59 IU/L — ABNORMAL HIGH (ref 0–40)
Albumin/Globulin Ratio: 1.3 (ref 1.2–2.2)
Albumin: 4.3 g/dL (ref 4.1–5.1)
Alkaline Phosphatase: 97 IU/L (ref 44–121)
BUN/Creatinine Ratio: 15 (ref 9–20)
BUN: 21 mg/dL (ref 6–24)
Bilirubin Total: 0.3 mg/dL (ref 0.0–1.2)
CO2: 25 mmol/L (ref 20–29)
Calcium: 9.9 mg/dL (ref 8.7–10.2)
Chloride: 100 mmol/L (ref 96–106)
Creatinine, Ser: 1.38 mg/dL — ABNORMAL HIGH (ref 0.76–1.27)
Globulin, Total: 3.3 g/dL (ref 1.5–4.5)
Glucose: 161 mg/dL — ABNORMAL HIGH (ref 70–99)
Potassium: 3.8 mmol/L (ref 3.5–5.2)
Sodium: 140 mmol/L (ref 134–144)
Total Protein: 7.6 g/dL (ref 6.0–8.5)
eGFR: 66 mL/min/{1.73_m2} (ref >59)

## 2022-03-31 LAB — THYROID PANEL WITH TSH
Free Thyroxine Index: 2.1 (ref 1.2–4.9)
T3 Uptake Ratio: 30 % (ref 24–39)
T4, Total: 7.1 ug/dL (ref 4.5–12.0)
TSH: 13.7 u[IU]/mL — ABNORMAL HIGH (ref 0.450–4.500)

## 2022-03-31 LAB — VITAMIN D 25 HYDROXY (VIT D DEFICIENCY, FRACTURES): Vit D, 25-Hydroxy: 11.3 ng/mL — ABNORMAL LOW (ref 30.0–100.0)

## 2022-03-31 MED ORDER — LEVOTHYROXINE SODIUM 300 MCG PO TABS: 300.0000 ug | ORAL_TABLET | Freq: Every day | ORAL | 1 refills | Status: DC

## 2022-03-31 MED ORDER — VITAMIN D (ERGOCALCIFEROL) 1.25 MG (50000 UNIT) PO CAPS: 50000.0000 [IU] | ORAL_CAPSULE | ORAL | 2 refills | Status: DC

## 2022-03-31 NOTE — Addendum Note (Signed)
Addended by: Roney Jaffe on: 03/31/2022 11:27 AM   Modules accepted: Orders

## 2022-05-13 ENCOUNTER — Other Ambulatory Visit: Payer: Self-pay

## 2022-05-13 ENCOUNTER — Encounter (HOSPITAL_COMMUNITY): Payer: Self-pay

## 2022-05-13 ENCOUNTER — Emergency Department (HOSPITAL_COMMUNITY)
Admission: EM | Admit: 2022-05-13 | Discharge: 2022-05-13 | Disposition: A | Discharge status: Home / Self Care | Payer: 59 | Attending: Emergency Medicine | Admitting: Emergency Medicine

## 2022-05-13 DIAGNOSIS — I509 Heart failure, unspecified: Secondary | ICD-10-CM | POA: Insufficient documentation

## 2022-05-13 DIAGNOSIS — R059 Cough, unspecified: Secondary | ICD-10-CM | POA: Diagnosis present

## 2022-05-13 DIAGNOSIS — Z1152 Encounter for screening for COVID-19: Secondary | ICD-10-CM | POA: Diagnosis not present

## 2022-05-13 DIAGNOSIS — Z79899 Other long term (current) drug therapy: Secondary | ICD-10-CM | POA: Diagnosis not present

## 2022-05-13 DIAGNOSIS — I130 Hypertensive heart and chronic kidney disease with heart failure and stage 1 through stage 4 chronic kidney disease, or unspecified chronic kidney disease: Secondary | ICD-10-CM | POA: Insufficient documentation

## 2022-05-13 DIAGNOSIS — N183 Chronic kidney disease, stage 3 unspecified: Secondary | ICD-10-CM | POA: Insufficient documentation

## 2022-05-13 DIAGNOSIS — J069 Acute upper respiratory infection, unspecified: Secondary | ICD-10-CM | POA: Insufficient documentation

## 2022-05-13 DIAGNOSIS — E1122 Type 2 diabetes mellitus with diabetic chronic kidney disease: Secondary | ICD-10-CM | POA: Insufficient documentation

## 2022-05-13 LAB — RESP PANEL BY RT-PCR (RSV, FLU A&B, COVID)  RVPGX2
Influenza A by PCR: NEGATIVE
Influenza B by PCR: NEGATIVE
Resp Syncytial Virus by PCR: NEGATIVE
SARS Coronavirus 2 by RT PCR: NEGATIVE

## 2022-05-13 NOTE — Discharge Instructions (Signed)
You tested negative for COVID and flu today. Please take tylenol/ibuprofen for pain/fever.  You can try TheraFlu or Mucinex for symptom relief. I recommend close follow-up with PCP for reevaluation.  Please do not hesitate to return to emergency department if worrisome signs symptoms we discussed become apparent.

## 2022-05-13 NOTE — ED Provider Notes (Signed)
[facility] DEPT Provider Note   CSN: [id number] Arrival date & time: 05/13/22  1948     History  Chief Complaint  Patient presents with   Cough   Nasal Congestion    Kyle Harrison is a 42 y.o. male with a past medical history of CHF, CKD, diabetes, hypertension presenting today for evaluation of cough and nasal congestion the last 2 days.  Patient reports that his roommate tested positive for flu.  Denies chest pain, shortness of breath, bowel changes, urinary symptoms.   Cough   Past Medical History:  Diagnosis Date   Acute on chronic respiratory failure with hypoxia and hypercapnia (HCC) 10/23/2018   CHF (congestive heart failure) (HCC)    Preserved EF   CKD (chronic kidney disease) stage 3, GFR 30-59 ml/min ([place]) 10/23/2018   Diabetes mellitus without complication ([place])    Hypertension    Hypertension associated with diabetes ([place]) 10/23/2018   Morbid (severe) obesity due to excess calories ([place]) 10/23/2018   Morbid obesity ([place])    Respiratory failure ([place]) 10/23/2018   Smoker    Past Surgical History:  Procedure Laterality Date   RIGHT HEART CATH N/A 12/15/2018   Procedure: RIGHT HEART CATH;  Surgeon: Larey Dresser, MD;  Location: [facility] [facility] LAB;  Service: Cardiovascular;  Laterality: N/A;     Home Medications Prior to Admission medications   Medication Sig Start Date End Date Taking? Authorizing Provider  hydrALAZINE (APRESOLINE) 50 MG tablet Take 1 tablet (50 mg total) by mouth 3 (three) times daily. 03/30/22   Mayers, Cari S, PA-C  levothyroxine (SYNTHROID) 300 MCG tablet Take 1 tablet (300 mcg total) by mouth daily. 03/31/22   Mayers, Cari S, PA-C  potassium chloride SA (KLOR-CON M) 20 MEQ tablet Take 1 tablet (20 mEq total) by mouth daily. 03/30/22   Mayers, Cari S, PA-C  sertraline (ZOLOFT) 50 MG tablet Take 1 tablet (50 mg total) by mouth daily. 03/30/22 06/28/22  Mayers, Cari S, PA-C  torsemide (DEMADEX) 20 MG tablet  Take 1 tablet (20 mg total) by mouth daily. 03/30/22   Mayers, Cari S, PA-C  Vitamin D, Ergocalciferol, (DRISDOL) 1.25 MG (50000 UNIT) CAPS capsule Take 1 capsule (50,000 Units total) by mouth every 7 (seven) days. 03/31/22   Mayers, Cari S, PA-C      Allergies    Patient has no known allergies.    Review of Systems   Review of Systems  Respiratory:  Positive for cough.     Physical Exam Updated Vital Signs BP (!) 150/95   Pulse 91   Temp 98.6 F (37 C) (Oral)   Resp 15   Ht 5\' 11"  (1.803 m)   Wt 136.1 kg   SpO2 94%   BMI 41.84 kg/m  Physical Exam Vitals and nursing note reviewed.  Constitutional:      Appearance: Normal appearance.  HENT:     Head: Normocephalic and atraumatic.     Mouth/Throat:     Mouth: Mucous membranes are moist.  Eyes:     General: No scleral icterus. Cardiovascular:     Rate and Rhythm: Normal rate and regular rhythm.     Pulses: Normal pulses.     Heart sounds: Normal heart sounds.  Pulmonary:     Effort: Pulmonary effort is normal.     Breath sounds: Normal breath sounds.  Abdominal:     General: Abdomen is flat.     Palpations: Abdomen is soft.     Tenderness:  There is no abdominal tenderness.  Musculoskeletal:        General: No deformity.  Skin:    General: Skin is warm.     Findings: No rash.  Neurological:     General: No focal deficit present.     Mental Status: He is alert.  Psychiatric:        Mood and Affect: Mood normal.    ED Results / Procedures / Treatments   Labs (all labs ordered are listed, but only abnormal results are displayed) Labs Reviewed  RESP PANEL BY RT-PCR (RSV, FLU A&B, COVID)  [id number]    EKG None  Radiology No results found.  Procedures Procedures    Medications Ordered in ED Medications - No data to display  ED Course/ Medical Decision Making/ A&P                           Medical Decision Making  This patient presents to the ED for cough, nasal congestion, sore throat, body aches,  this involves an extensive number of treatment options, and is a complaint that carries with a high risk of complications and morbidity.  The differential diagnosis includes flu, COVID, RSV, strep, pharyngitis, bronchitis, pneumonia, infectious etiology.  This is not an exhaustive list.  Lab tests: I ordered and personally interpreted labs.  The pertinent results include: Viral panel negative.  Imaging studies:  Problem list/ ED course/ Critical interventions/ Medical management: HPI: See above Vital signs within normal range and stable throughout visit. Laboratory/imaging studies significant for: See above. On physical examination, patient is afebrile and appears in no acute distress. This patient presents with symptoms suspicious for viral upper respiratory infection. Based on history and physical doubt sinusitis. COVID test was negative. Do not suspect underlying cardiopulmonary process. I considered, but think unlikely, pneumonia. Patient is nontoxic appearing and not in need of emergent medical intervention. Patient told to self isolate at home until symptoms subside for 72 hours.  Recommended patient to take TheraFlu or Mucinex for symptom relief.  Follow-up with primary care physician for further evaluation and management.  Return to the ER if new or worsening symptoms. I have reviewed the patient home medicines and have made adjustments as needed.  Cardiac monitoring/EKG: The patient was maintained on a cardiac monitor.  I personally reviewed and interpreted the cardiac monitor which showed an underlying rhythm of: sinus rhythm.  Additional history obtained: External records from outside source obtained and reviewed including: Chart review including previous notes, labs, imaging.  Consultations obtained:  Disposition Continued outpatient therapy. Follow-up with PCP recommended for reevaluation of symptoms. Treatment plan discussed with patient.  Pt acknowledged understanding was  agreeable to the plan. Worrisome signs and symptoms were discussed with patient, and patient acknowledged understanding to return to the ED if they noticed these signs and symptoms. Patient was stable upon discharge.   This chart was dictated using voice recognition software.  Despite best efforts to proofread,  errors can occur which can change the documentation meaning.          Final Clinical Impression(s) / ED Diagnoses Final diagnoses:  Viral upper respiratory tract infection    Rx / DC Orders ED Discharge Orders     None         Rex Kras, [place] 05/14/22 0107    Drenda Freeze, MD 05/20/22 [phone]

## 2022-05-13 NOTE — ED Triage Notes (Signed)
Pt states that his roommate tested positive for the flu. Pt has been having cough and congestion x 2 days.

## 2024-04-17 ENCOUNTER — Ambulatory Visit: Payer: MEDICAID | Admitting: Physician Assistant

## 2024-04-17 ENCOUNTER — Encounter: Payer: Self-pay | Admitting: Physician Assistant

## 2024-04-17 VITALS — BP 140/88 | HR 95 | Wt 354.0 lb

## 2024-04-17 DIAGNOSIS — R7303 Prediabetes: Secondary | ICD-10-CM | POA: Diagnosis not present

## 2024-04-17 DIAGNOSIS — E039 Hypothyroidism, unspecified: Secondary | ICD-10-CM

## 2024-04-17 DIAGNOSIS — I10 Essential (primary) hypertension: Secondary | ICD-10-CM

## 2024-04-17 DIAGNOSIS — G4730 Sleep apnea, unspecified: Secondary | ICD-10-CM

## 2024-04-17 DIAGNOSIS — R600 Localized edema: Secondary | ICD-10-CM

## 2024-04-17 LAB — POCT GLYCOSYLATED HEMOGLOBIN (HGB A1C): HbA1c, POC (prediabetic range): 5.6 % — AB (ref 5.7–6.4)

## 2024-04-17 MED ORDER — OZEMPIC (0.25 OR 0.5 MG/DOSE) 2 MG/3ML ~~LOC~~ SOPN: 0.2500 mg | PEN_INJECTOR | SUBCUTANEOUS | 1 refills | Status: DC

## 2024-04-17 NOTE — Progress Notes (Unsigned)
 Established Patient Office Visit  Subjective   Patient ID: Kyle Harrison, male    DOB: 16-Apr-1981  Age: 43 y.o. MRN: [id number]  Chief Complaint  Patient presents with   Hypertension    Follow up on chronic health conditions and mangement  Discussed the use of AI scribe software for clinical note transcription with the patient, who gave verbal consent to proceed.  History of Present Illness   Kyle Harrison is a 43 year old male with hypertension and prediabetes who presents with leg swelling.  He is currently being treated for substance abuse at [facility] recovery center  He has bilateral leg swelling that worsens by the end of the day and improves overnight. He restarted furosemide  20 mg once daily yesterday after being off it for a period. He was previously on torsemide  but does not recall the reason for the change.  He has hypertension and takes hydralazine  once daily, reduced from three times daily, but is unsure why the dose was decreased. He does not regularly check his blood pressure at home.  He has prediabetes with a previously elevated hemoglobin A1c and is not on medication for this.  He has hypothyroidism treated with Synthroid  225 mcg daily. Thyroid  labs were last checked in August 2025.  He has sleep apnea with a new CPAP machine. His current mask is too small and he is working with [facility] to obtain a properly fitting mask.     Past Medical History:  Diagnosis Date   Acute on chronic respiratory failure with hypoxia and hypercapnia (HCC) 10/23/2018   CHF (congestive heart failure) (HCC)    Preserved EF   CKD (chronic kidney disease) stage 3, GFR 30-59 ml/min (HCC) 10/23/2018   Diabetes mellitus without complication (HCC)    Hypertension    Hypertension associated with diabetes (HCC) 10/23/2018   Morbid (severe) obesity due to excess calories (HCC) 10/23/2018   Morbid obesity (HCC)    Respiratory failure (HCC) 10/23/2018   Smoker    Social  History   Socioeconomic History   Marital status: Single    Spouse name: Not on file   Number of children: Not on file   Years of education: Not on file   Highest education level: Not on file  Occupational History   Not on file  Tobacco Use   Smoking status: Former    Current packs/day: 0.50    Types: Cigarettes   Smokeless tobacco: Never  Substance and Sexual Activity   Alcohol use: No   Drug use: No   Sexual activity: Not Currently  Other Topics Concern   Not on file  Social History Narrative   Not on file   Social Drivers of Health   Tobacco Use: Medium Risk (04/18/2024)   Patient History    Smoking Tobacco Use: Former    Smokeless Tobacco Use: Never    Passive Exposure: Not on [employer] Strain: Not on file  Food Insecurity: Low Risk (11/09/2023)   Received from [facility]   Epic    Within the past 12 months, you worried that your food would run out before you got money to buy more: Never true    Within the past 12 months, the food you bought just didn't last and you didn't have money to get more. : Never true  Transportation Needs: No Transportation Needs (11/09/2023)   Received from [facility]    In the past 12 months, has lack of reliable  transportation kept you from medical appointments, meetings, work or from getting things needed for daily living? : No  Physical Activity: Not on file  Stress: Not on file  Social Connections: Not on file  Intimate Partner Violence: Not on file  Depression (EYV7-0): Not on file  Alcohol Screen: Not on file  Housing: Low Risk (11/09/2023)   Received from [facility]   Epic    What is your living situation today?: I have a steady place to live    Think about the place you live. Do you have problems with any of the following? Choose all that apply:: None/None on this list  Utilities: Low Risk (11/09/2023)   Received from [facility]   Utilities    In the past 12 months has the electric, gas,  oil, or water company threatened to shut off services in your home? : No  Health Literacy: Not on file   Family History  Problem Relation Age of Onset   Graves' disease Mother    Hypertension Mother    Allergies[1]  Review of Systems  Constitutional: Negative.   HENT: Negative.    Eyes: Negative.   Respiratory:  Negative for shortness of breath.   Cardiovascular:  Negative for chest pain.  Gastrointestinal: Negative.   Genitourinary: Negative.   Musculoskeletal: Negative.   Skin: Negative.   Neurological: Negative.   Endo/Heme/Allergies: Negative.   Psychiatric/Behavioral: Negative.        Objective:     BP (!) 140/88 (BP Location: Left Arm, Patient Position: Sitting, Cuff Size: Large)   Pulse 95   Wt (!) 354 lb (160.6 kg)   SpO2 90%   BMI 49.37 kg/m  BP Readings from Last 3 Encounters:  04/17/24 (!) 140/88  05/13/22 (!) 148/88  03/30/22 137/83   Wt Readings from Last 3 Encounters:  04/17/24 (!) 354 lb (160.6 kg)  05/13/22 300 lb (136.1 kg)  03/30/22 (!) 380 lb (172.4 kg)    Physical Exam Vitals and nursing note reviewed.  Constitutional:      Appearance: Normal appearance. He is obese.  HENT:     Head: Normocephalic and atraumatic.     Right Ear: External ear normal.     Left Ear: External ear normal.     Nose: Nose normal.     Mouth/Throat:     Mouth: Mucous membranes are moist.     Pharynx: Oropharynx is clear.  Eyes:     Extraocular Movements: Extraocular movements intact.     Conjunctiva/sclera: Conjunctivae normal.     Pupils: Pupils are equal, round, and reactive to light.  Cardiovascular:     Rate and Rhythm: Normal rate and regular rhythm.     Pulses: Normal pulses.          Dorsalis pedis pulses are 2+ on the right side and 2+ on the left side.       Posterior tibial pulses are 2+ on the right side and 2+ on the left side.     Heart sounds: Normal heart sounds.  Pulmonary:     Effort: Pulmonary effort is normal.     Breath sounds: Normal  breath sounds.  Musculoskeletal:     Cervical back: Normal range of motion and neck supple.     Right lower leg: 1+ Pitting Edema present.     Left lower leg: 1+ Pitting Edema present.  Neurological:     Mental Status: He is alert.       Assessment & Plan:   Problem  List Items Addressed This Visit       Cardiovascular and Mediastinum   Essential hypertension - Primary   Relevant Medications   furosemide  (LASIX ) 20 MG tablet   hydrALAZINE  (APRESOLINE ) 50 MG tablet     Respiratory   Sleep apnea   Relevant Medications   Semaglutide ,0.25 or 0.5MG /DOS, (OZEMPIC , 0.25 OR 0.5 MG/DOSE,) 2 MG/3ML SOPN     Other   Morbid (severe) obesity due to excess calories (HCC) (Chronic)   Relevant Medications   Semaglutide ,0.25 or 0.5MG /DOS, (OZEMPIC , 0.25 OR 0.5 MG/DOSE,) 2 MG/3ML SOPN   Other Visit Diagnoses       Prediabetes       Relevant Medications   Semaglutide ,0.25 or 0.5MG /DOS, (OZEMPIC , 0.25 OR 0.5 MG/DOSE,) 2 MG/3ML SOPN   Other Relevant Orders   HgB A1c (Completed)     Localized edema       Relevant Orders   Basic Metabolic Panel (Completed)     Hypothyroidism, unspecified type       Relevant Medications   levothyroxine  (SYNTHROID ) 200 MCG tablet   levothyroxine  (SYNTHROID ) 25 MCG tablet   Other Relevant Orders   Thyroid  Panel With TSH (Completed)       Assessment and Plan Essential hypertension Blood pressure elevated at 140/88 mmHg. On hydralazine  and Lasix . - Monitor blood pressure daily. - Reassess blood pressure in two weeks.  Lower extremity edema Persistent leg swelling, possibly due to change from torsemide  to furosemide . Lasix  restarted. - Continue Lasix  once daily. - Elevate feet when possible. - Encourage walking to improve circulation.  Hypothyroidism Abnormal thyroid  levels in August. On Synthroid  225 mcg daily. - Rechecked thyroid  levels. - Adjust Synthroid  dosage based on lab results.  Prediabetes Discussed Ozempic  for weight management and  glycemic control. Explained administration and side effects. - Checked hemoglobin A1c (5.6) - Initiated Ozempic  pending insurance approval.  Morbid obesity due to excess calories Morbid obesity with interest in weight management. Discussed Ozempic  for weight loss. - Initiated Ozempic  pending insurance approval.  Sleep apnea New CPAP machine obtained, mask size too small. Appointment with pulmonologist scheduled. - Attend pulmonologist appointment for CPAP mask fitting and paperwork.  Currently in substance abuse treatment program.   I have reviewed the patient's medical history (PMH, PSH, Social History, Family History, Medications, and allergies) , and have been updated if relevant. I spent 30 minutes reviewing chart and  face to face time with patient.    Return in about 2 weeks (around 05/01/2024) for With MMU.    Clarice Bonaventure S Mayers, PA-C     [1] No Known Allergies

## 2024-04-17 NOTE — Patient Instructions (Signed)
 VISIT SUMMARY:  Today, we discussed your leg swelling, blood pressure, thyroid  levels, prediabetes, weight management, and sleep apnea. We reviewed your medications and made some adjustments to better manage your conditions.  YOUR PLAN:  -ESSENTIAL HYPERTENSION: Hypertension means high blood pressure. Your blood pressure was elevated at 140/88 mmHg. You should monitor your blood pressure daily and we will reassess it in two weeks.  -LOWER EXTREMITY EDEMA: Lower extremity edema means swelling in your legs. This may be due to the change from torsemide  to furosemide . Continue taking Lasix  once daily, elevate your feet when possible, and try to walk to improve circulation.  -HYPOTHYROIDISM: Hypothyroidism means your thyroid  is underactive. Your thyroid  levels were abnormal in August. We rechecked your thyroid  levels today and will adjust your Synthroid  dosage based on the lab results.  -PREDIABETES: Prediabetes means your blood sugar levels are higher than normal but not high enough to be diabetes. We discussed starting Ozempic  to help with weight management and blood sugar control. We checked your hemoglobin A1c and will start Ozempic  once it is approved by your insurance.  -MORBID OBESITY DUE TO EXCESS CALORIES: Morbid obesity means having a very high body weight. We discussed using Ozempic  to help with weight loss. We will start Ozempic  once it is approved by your insurance.  -SLEEP APNEA: Sleep apnea means you have pauses in breathing or shallow breaths while you sleep. You have a new CPAP machine, but the mask is too small. You have an appointment with a pulmonologist to get a properly fitting mask.

## 2024-04-18 ENCOUNTER — Encounter: Payer: Self-pay | Admitting: Physician Assistant

## 2024-04-18 ENCOUNTER — Ambulatory Visit: Payer: Self-pay | Admitting: Physician Assistant

## 2024-04-18 DIAGNOSIS — E039 Hypothyroidism, unspecified: Secondary | ICD-10-CM

## 2024-04-18 LAB — THYROID PANEL WITH TSH
Free Thyroxine Index: 1.9 (ref 1.2–4.9)
T3 Uptake Ratio: 28 % (ref 24–39)
T4, Total: 6.9 ug/dL (ref 4.5–12.0)
TSH: 13.5 u[IU]/mL — ABNORMAL HIGH (ref 0.450–4.500)

## 2024-04-18 LAB — BASIC METABOLIC PANEL WITH GFR
BUN/Creatinine Ratio: 15 (ref 9–20)
BUN: 18 mg/dL (ref 6–24)
CO2: 23 mmol/L (ref 20–29)
Calcium: 9.4 mg/dL (ref 8.7–10.2)
Chloride: 98 mmol/L (ref 96–106)
Creatinine, Ser: 1.22 mg/dL (ref 0.76–1.27)
Glucose: 169 mg/dL — ABNORMAL HIGH (ref 70–99)
Potassium: 3.9 mmol/L (ref 3.5–5.2)
Sodium: 139 mmol/L (ref 134–144)
eGFR: 75 mL/min/1.73 (ref >59)

## 2024-04-18 MED ORDER — LEVOTHYROXINE SODIUM 50 MCG PO TABS: 50.0000 ug | ORAL_TABLET | Freq: Every day | ORAL | 1 refills | Status: AC

## 2024-04-18 NOTE — Progress Notes (Signed)
 Test results and provider recommendations given to [facility] aftercare coordinator

## 2024-05-01 ENCOUNTER — Encounter: Payer: Self-pay | Admitting: Physician Assistant

## 2024-05-01 ENCOUNTER — Other Ambulatory Visit: Payer: Self-pay

## 2024-05-01 ENCOUNTER — Telehealth: Payer: Self-pay

## 2024-05-01 ENCOUNTER — Ambulatory Visit: Payer: MEDICAID | Admitting: Physician Assistant

## 2024-05-01 VITALS — BP 136/101 | HR 101 | Ht 70.0 in | Wt 353.0 lb

## 2024-05-01 DIAGNOSIS — I10 Essential (primary) hypertension: Secondary | ICD-10-CM | POA: Diagnosis not present

## 2024-05-01 DIAGNOSIS — Z6841 Body Mass Index (BMI) 40.0 and over, adult: Secondary | ICD-10-CM

## 2024-05-01 DIAGNOSIS — Z13220 Encounter for screening for lipoid disorders: Secondary | ICD-10-CM

## 2024-05-01 DIAGNOSIS — F1410 Cocaine abuse, uncomplicated: Secondary | ICD-10-CM | POA: Diagnosis not present

## 2024-05-01 DIAGNOSIS — E559 Vitamin D deficiency, unspecified: Secondary | ICD-10-CM | POA: Insufficient documentation

## 2024-05-01 DIAGNOSIS — Z8739 Personal history of other diseases of the musculoskeletal system and connective tissue: Secondary | ICD-10-CM | POA: Insufficient documentation

## 2024-05-01 DIAGNOSIS — G4730 Sleep apnea, unspecified: Secondary | ICD-10-CM

## 2024-05-01 MED ORDER — HYDRALAZINE HCL 50 MG PO TABS: 50.0000 mg | ORAL_TABLET | Freq: Two times a day (BID) | ORAL | 1 refills | Status: DC

## 2024-05-01 NOTE — Telephone Encounter (Signed)
 Received a prior auth request for Ozempic  today. Patient has [facility] plan and the coverage criteria is as follows:  Criteria for Initial Coverage (for both preferred and non-preferred products) ? Beneficiary must have diagnosis of Type 2 Diabetes; AND ? Beneficiary must have a trial and failure or insufficient response to metformin  containing products OR o Beneficiary has a contraindication or adverse event to metformin, OR o Beneficiary has established ASCVD, or Chronic Kidney Disease, OR o Beneficiary is considered high-risk for ASCVD as defined as >= 4 years of age  with >= 2 additional risk factors (smoking, obesity, hypertension, dyslipidemia,  or albuminuria)  Patient does not currently meet medical criteria.

## 2024-05-01 NOTE — Progress Notes (Signed)
 "  Established Patient Office Visit  Subjective   Patient ID: Kyle Harrison, male    DOB: 10/23/1980  Age: 43 y.o. MRN: [id number]  Chief Complaint  Patient presents with   Medication Refill    Medication refill and fasting labs. He is fasting  Discussed the use of AI scribe software for clinical note transcription with the patient, who gave verbal consent to proceed.  History of Present Illness   Kyle Harrison is a 43 year old male with hypertension and thyroid  disorder who presents for follow-up on blood pressure management and thyroid  medication adjustment.  He continues to be treated for substance abuse at [facility] recovery center.  Does plan on staying for the long-term program  His home blood pressure readings since the last visit have been in the 140s to 150s/100s. He takes hydralazine  50 mg  daily and Lasix  20 mg daily.   He has history of gout and uses colchicine  as needed.  No recent flareup f  He has sleep apnea and is awaiting CPAP supplies from [facility] ordered on December 17th and has not yet received them.    Past Medical History:  Diagnosis Date   Acute on chronic respiratory failure with hypoxia and hypercapnia (HCC) 10/23/2018   CHF (congestive heart failure) (HCC)    Preserved EF   CKD (chronic kidney disease) stage 3, GFR 30-59 ml/min (HCC) 10/23/2018   Diabetes mellitus without complication (HCC)    Hypertension    Hypertension associated with diabetes (HCC) 10/23/2018   Morbid (severe) obesity due to excess calories (HCC) 10/23/2018   Morbid obesity (HCC)    Respiratory failure (HCC) 10/23/2018   Smoker    Social History   Socioeconomic History   Marital status: Single    Spouse name: Not on file   Number of children: Not on file   Years of education: Not on file   Highest education level: Not on file  Occupational History   Not on file  Tobacco Use   Smoking status: Former    Current packs/day: 0.50    Types: Cigarettes    Smokeless tobacco: Never  Substance and Sexual Activity   Alcohol use: No   Drug use: No   Sexual activity: Not Currently  Other Topics Concern   Not on file  Social History Narrative   Not on file   Social Drivers of Health   Tobacco Use: Medium Risk (05/01/2024)   Patient History    Smoking Tobacco Use: Former    Smokeless Tobacco Use: Never    Passive Exposure: Not on Actuary Strain: Not on file  Food Insecurity: Low Risk (04/19/2024)   Received from [facility]   Epic    Within the past 12 months, you worried that your food would run out before you got money to buy more: Never true    Within the past 12 months, the food you bought just didn't last and you didn't have money to get more. : Never true  Transportation Needs: No Transportation Needs (04/19/2024)   Received from [employer]    In the past 12 months, has lack of reliable transportation kept you from medical appointments, meetings, work or from getting things needed for daily living? : No  Physical Activity: Not on file  Stress: Not on file  Social Connections: Not on file  Intimate Partner Violence: Not on file  Depression (EYV7-0): Not on file  Alcohol Screen: Not on file  Housing: Low Risk (04/19/2024)   Received from [facility]   Epic    What is your living situation today?: I have a steady place to live    Think about the place you live. Do you have problems with any of the following? Choose all that apply:: None/None on this list  Utilities: Low Risk (04/19/2024)   Received from [facility]   Utilities    In the past 12 months has the electric, gas, oil, or water company threatened to shut off services in your home? : No  Health Literacy: Not on file   Family History  Problem Relation Age of Onset   Graves' disease Mother    Hypertension Mother    Allergies[1]  Review of Systems  Constitutional: Negative.   HENT: Negative.    Eyes: Negative.    Respiratory:  Negative for shortness of breath.   Cardiovascular:  Negative for chest pain.  Gastrointestinal: Negative.   Genitourinary: Negative.   Musculoskeletal: Negative.   Skin: Negative.   Neurological: Negative.   Endo/Heme/Allergies: Negative.   Psychiatric/Behavioral: Negative.        Objective:     BP (!) 136/101 (BP Location: Left Arm, Patient Position: Sitting)   Pulse (!) 101   Ht 5' 10 (1.778 m)   Wt (!) 353 lb (160.1 kg)   SpO2 98%   BMI 50.65 kg/m  BP Readings from Last 3 Encounters:  05/01/24 (!) 136/101  04/17/24 (!) 140/88  05/13/22 (!) 148/88   Wt Readings from Last 3 Encounters:  05/01/24 (!) 353 lb (160.1 kg)  04/17/24 (!) 354 lb (160.6 kg)  05/13/22 300 lb (136.1 kg)    Physical Exam Vitals and nursing note reviewed.  Constitutional:      Appearance: Normal appearance. He is obese.  HENT:     Head: Normocephalic and atraumatic.     Right Ear: External ear normal.     Left Ear: External ear normal.     Nose: Nose normal.     Mouth/Throat:     Mouth: Mucous membranes are moist.     Pharynx: Oropharynx is clear.  Eyes:     Extraocular Movements: Extraocular movements intact.     Conjunctiva/sclera: Conjunctivae normal.     Pupils: Pupils are equal, round, and reactive to light.  Cardiovascular:     Rate and Rhythm: Normal rate and regular rhythm.     Pulses: Normal pulses.          Dorsalis pedis pulses are 2+ on the right side and 2+ on the left side.       Posterior tibial pulses are 2+ on the right side and 2+ on the left side.     Heart sounds: Normal heart sounds.  Pulmonary:     Effort: Pulmonary effort is normal.     Breath sounds: Normal breath sounds.  Musculoskeletal:     Cervical back: Normal range of motion and neck supple.     Right lower leg: 1+ Edema present.     Left lower leg: 1+ Edema present.  Skin:    General: Skin is warm and dry.  Neurological:     General: No focal deficit present.     Mental Status: He  is alert and oriented to person, place, and time.  Psychiatric:        Mood and Affect: Mood normal.        Thought Content: Thought content normal.        Judgment: Judgment normal.  Assessment & Plan:   Problem List Items Addressed This Visit       Cardiovascular and Mediastinum   Essential hypertension   Relevant Medications   hydrALAZINE  (APRESOLINE ) 50 MG tablet     Respiratory   Sleep apnea     Other   Morbid (severe) obesity due to excess calories (HCC) (Chronic)   Cocaine abuse (HCC)   History of gout   Relevant Orders   Uric Acid   Vitamin D  deficiency   Relevant Orders   Vitamin D , 25-hydroxy   Other Visit Diagnoses       Screening, lipid    -  Primary   Relevant Orders   Lipid panel      1. Essential hypertension Increase hydralazine  to twice daily.  Patient encouraged to continue checking blood pressure on a daily basis, keeping a written log and having available for all office visits.  Red flags given for prompt reevaluation.  Patient to return to mobile unit in 2 weeks. - hydrALAZINE  (APRESOLINE ) 50 MG tablet; Take 1 tablet (50 mg total) by mouth 2 (two) times daily.  Dispense: 60 tablet; Refill: 1  2. Vitamin D  deficiency  - Vitamin D , 25-hydroxy  3. History of gout  - Uric Acid  4. Morbid (severe) obesity due to excess calories (HCC)   5. Sleep apnea, unspecified type Follow up with [facility] for supplies   6. Cocaine abuse (HCC) Currently in substance abuse treatment program  7. Screening, lipid (Primary) Fasting labs completed today - Lipid panel   I have reviewed the patient's medical history (PMH, PSH, Social History, Family History, Medications, and allergies) , and have been updated if relevant. I spent 30 minutes reviewing chart and  face to face time with patient.   Return in about 2 weeks (around 05/15/2024) for With [facility].    Tadan Shill S Mayers, PA-C     [1] No Known Allergies  "

## 2024-05-01 NOTE — Patient Instructions (Signed)
 VISIT SUMMARY:  Today, we reviewed your blood pressure management, thyroid  medication, and other health concerns. We discussed your current medications, recent blood pressure readings, and challenges with weight management. We also addressed your sleep apnea, vitamin D  deficiency, and gout management.  YOUR PLAN:  -ESSENTIAL HYPERTENSION: Essential hypertension means high blood pressure without a known secondary cause. Your recent readings are still high, so we have increased your hydralazine  to twice daily. Please check your blood pressure daily to monitor your progress.  -SLEEP APNEA: Sleep apnea is a condition where breathing repeatedly stops and starts during sleep. You are still waiting for your CPAP supplies from [facility]. Please contact them to expedite this process. We will re-evaluate your condition in 2-3 months.  -HYPOTHYROIDISM: Hypothyroidism is when your thyroid  gland doesn't produce enough thyroid  hormone. Continue with your current thyroid  medication dosage, and we will re-evaluate your thyroid  function in five weeks.  -OBESITY: Obesity is a condition characterized by excessive body fat. It is affecting your ability to work and exercise. Please continue your participation in the weight management program at [facility].  -VITAMIN D  DEFICIENCY: Vitamin D  deficiency means you have lower than normal levels of vitamin D . We have ordered a vitamin D  level check as part of your routine blood work.  -GOUT: Gout is a form of arthritis characterized by severe pain, redness, and tenderness in joints. Although you are not currently experiencing a gout attack, we have ordered a uric acid level check to determine if you need daily medication for gout.  -GENERAL HEALTH MAINTENANCE: We have ordered fasting labs to check your cholesterol levels as part of your routine health maintenance.

## 2024-05-03 ENCOUNTER — Ambulatory Visit: Payer: Self-pay | Admitting: Physician Assistant

## 2024-05-03 DIAGNOSIS — Z8739 Personal history of other diseases of the musculoskeletal system and connective tissue: Secondary | ICD-10-CM

## 2024-05-03 DIAGNOSIS — Z9189 Other specified personal risk factors, not elsewhere classified: Secondary | ICD-10-CM

## 2024-05-03 DIAGNOSIS — E559 Vitamin D deficiency, unspecified: Secondary | ICD-10-CM

## 2024-05-03 LAB — VITAMIN D 25 HYDROXY (VIT D DEFICIENCY, FRACTURES): Vit D, 25-Hydroxy: 15.4 ng/mL — ABNORMAL LOW (ref 30.0–100.0)

## 2024-05-03 LAB — LIPID PANEL
Chol/HDL Ratio: 3.9 ratio (ref 0.0–5.0)
Cholesterol, Total: 184 mg/dL (ref 100–199)
HDL: 47 mg/dL
LDL Chol Calc (NIH): 107 mg/dL — ABNORMAL HIGH (ref 0–99)
Triglycerides: 170 mg/dL — ABNORMAL HIGH (ref 0–149)
VLDL Cholesterol Cal: 30 mg/dL (ref 5–40)

## 2024-05-03 LAB — URIC ACID: Uric Acid: 8.2 mg/dL (ref 3.8–8.4)

## 2024-05-03 MED ORDER — VITAMIN D (ERGOCALCIFEROL) 1.25 MG (50000 UNIT) PO CAPS: 50000.0000 [IU] | ORAL_CAPSULE | ORAL | 2 refills | Status: AC

## 2024-05-03 MED ORDER — ATORVASTATIN CALCIUM 10 MG PO TABS: 10.0000 mg | ORAL_TABLET | Freq: Every day | ORAL | 2 refills | Status: AC

## 2024-05-03 MED ORDER — ALLOPURINOL 100 MG PO TABS: 100.0000 mg | ORAL_TABLET | Freq: Every day | ORAL | 1 refills | Status: DC

## 2024-05-03 NOTE — Progress Notes (Signed)
 Detailed message left with Stacie Hill aftercare coordinator at [facility] with Test results and provider recommendations.

## 2024-05-22 ENCOUNTER — Encounter: Payer: Self-pay | Admitting: Physician Assistant

## 2024-05-22 ENCOUNTER — Ambulatory Visit: Payer: MEDICAID | Admitting: Physician Assistant

## 2024-05-22 VITALS — BP 134/80 | HR 77 | Ht 71.0 in | Wt 366.0 lb

## 2024-05-22 DIAGNOSIS — J302 Other seasonal allergic rhinitis: Secondary | ICD-10-CM

## 2024-05-22 DIAGNOSIS — H04123 Dry eye syndrome of bilateral lacrimal glands: Secondary | ICD-10-CM | POA: Diagnosis not present

## 2024-05-22 DIAGNOSIS — L853 Xerosis cutis: Secondary | ICD-10-CM

## 2024-05-22 DIAGNOSIS — R7303 Prediabetes: Secondary | ICD-10-CM

## 2024-05-22 DIAGNOSIS — Z8739 Personal history of other diseases of the musculoskeletal system and connective tissue: Secondary | ICD-10-CM | POA: Diagnosis not present

## 2024-05-22 DIAGNOSIS — I10 Essential (primary) hypertension: Secondary | ICD-10-CM

## 2024-05-22 DIAGNOSIS — E039 Hypothyroidism, unspecified: Secondary | ICD-10-CM | POA: Diagnosis not present

## 2024-05-22 DIAGNOSIS — H6121 Impacted cerumen, right ear: Secondary | ICD-10-CM

## 2024-05-22 DIAGNOSIS — F1410 Cocaine abuse, uncomplicated: Secondary | ICD-10-CM

## 2024-05-22 DIAGNOSIS — G4730 Sleep apnea, unspecified: Secondary | ICD-10-CM

## 2024-05-22 DIAGNOSIS — Z6841 Body Mass Index (BMI) 40.0 and over, adult: Secondary | ICD-10-CM

## 2024-05-22 MED ORDER — HYDRALAZINE HCL 50 MG PO TABS: 50.0000 mg | ORAL_TABLET | Freq: Three times a day (TID) | ORAL | 1 refills | Status: AC

## 2024-05-22 MED ORDER — WEGOVY 0.25 MG/0.5ML ~~LOC~~ SOAJ: 0.2500 mg | SUBCUTANEOUS | 1 refills | Status: AC

## 2024-05-22 MED ORDER — CARBOXYMETHYLCELLULOSE SODIUM 0.5 % OP SOLN: 1.0000 [drp] | Freq: Three times a day (TID) | OPHTHALMIC | 1 refills | Status: AC | PRN

## 2024-05-22 MED ORDER — CETIRIZINE HCL 10 MG PO TABS: 10.0000 mg | ORAL_TABLET | Freq: Every day | ORAL | 11 refills | Status: AC | PRN

## 2024-05-22 MED ORDER — CETAPHIL MOISTURIZING EX LOTN: 1.0000 | TOPICAL_LOTION | CUTANEOUS | 0 refills | Status: AC | PRN

## 2024-05-22 NOTE — Patient Instructions (Signed)
 VISIT SUMMARY:  During your visit, we discussed your blood pressure management, ear pain, dry skin, eye irritation, and other health concerns. We made adjustments to your medications and provided recommendations to help manage your symptoms.  YOUR PLAN:  -ESSENTIAL HYPERTENSION: Essential hypertension means high blood pressure without a known secondary cause. Your blood pressure remains elevated, so we have increased your hydralazine  dosage to three times daily.  -MORBID OBESITY DUE TO EXCESS CALORIES: Morbid obesity is a condition of excessive body weight that increases the risk of health problems. We attempted to obtain Wegovy  for you after Ozempic  was denied by Medicaid.  -DRY SKIN (XEROSIS CUTIS): Xerosis cutis is a condition of persistently dry skin. We have prescribed Cetaphil lotion to help manage your dry skin.  -DRY EYE SYNDROME, BILATERAL: Dry eye syndrome is a condition where your eyes do not produce enough tears or the right quality of tears. We recommended taking daily Zyrtec  for two weeks and prescribed eye drops to alleviate your symptoms.  -IMPACTED CERUMEN, RIGHT EAR: Impacted cerumen is a buildup of earwax that can cause discomfort. We advised against using Q-tips and recommended using warm water in the shower to help clear the earwax. If necessary, we may refer you to an ENT specialist.   -GOUT WITH HYPERURICEMIA: Gout is a form of arthritis characterized by high levels of uric acid in the blood. We rechecked your uric acid levels today to monitor your condition.  -VITAMIN D  DEFICIENCY: Vitamin D  deficiency means you have lower than normal levels of vitamin D . Continue taking your vitamin D  supplement once weekly for 12 weeks.  -SEASONAL ALLERGIC RHINITIS: Seasonal allergic rhinitis is an allergic reaction to seasonal allergens like pollen. We recommended taking daily Zyrtec  for two weeks to help manage your symptoms.  INSTRUCTIONS:  Please follow the new medication  regimen and recommendations provided. We will recheck your thyroid  levels in two weeks. If your ear discomfort persists, we may refer you to an ENT specialist. Continue taking your vitamin D  supplement once weekly for 12 weeks.

## 2024-05-22 NOTE — Progress Notes (Signed)
 "  Established Patient Office Visit  Subjective   Patient ID: Kyle Harrison, male    DOB: [birth year]  Age: 44 y.o. MRN: [id number]  Chief Complaint  Patient presents with   Hypertension    3 week follow up on chronic health conditions and management    Diabetes   Discussed the use of AI scribe software for clinical note transcription with the patient, who gave verbal consent to proceed.  History of Present Illness   Kyle Harrison is a 44 year old male with hypertension who presents for follow-up of blood pressure management and ear pain.  He continues to be treated for substance abuse at [facility]  He has had persistently elevated home and clinic blood pressure readings despite an increase in hydralazine  to twice daily, with recent values up to about 160 systolic and as high as 157/110 over the past week. He has checked his blood pressure daily since October.  He has recurrent right ear pain from a localized lesion in the ear canal that first appeared a couple of months ago and drained clear fluid after it busted. The sore resolved with Neosporin and peroxide but has returned in the same spot with tingling when touched.  He has dry, irritated skin and is requesting a cream, which he has used in the past but cannot recall by name.  He has eye irritation with redness, itching, and burning, worse in the mornings. He thinks indoor heat may be causing dryness.      Past Medical History:  Diagnosis Date   Acute on chronic respiratory failure with hypoxia and hypercapnia (HCC) 10/23/2018   CHF (congestive heart failure) (HCC)    Preserved EF   CKD (chronic kidney disease) stage 3, GFR 30-59 ml/min (HCC) 10/23/2018   Diabetes mellitus without complication (HCC)    Hypertension    Hypertension associated with diabetes (HCC) 10/23/2018   Morbid (severe) obesity due to excess calories (HCC) 10/23/2018   Morbid obesity (HCC)    Respiratory failure (HCC)  10/23/2018   Smoker    Social History   Socioeconomic History   Marital status: Single    Spouse name: Not on file   Number of children: Not on file   Years of education: Not on file   Highest education level: Not on file  Occupational History   Not on file  Tobacco Use   Smoking status: Former    Current packs/day: 0.50    Average packs/day: 0.5 packs/day for 24.0 years (12.0 ttl pk-yrs)    Types: Cigarettes    Start date: 2002   Smokeless tobacco: Never  Substance and Sexual Activity   Alcohol use: No   Drug use: No   Sexual activity: Not Currently  Other Topics Concern   Not on file  Social History Narrative   Not on file   Social Drivers of Health   Tobacco Use: Medium Risk (05/22/2024)   Patient History    Smoking Tobacco Use: Former    Smokeless Tobacco Use: Never    Passive Exposure: Not on Actuary Strain: Not on file  Food Insecurity: Low Risk (04/19/2024)   Received from [facility]   Epic    Within the past 12 months, you worried that your food would run out before you got money to buy more: Never true    Within the past 12 months, the food you bought just didn't last and you didn't have money to get more. : Never  true  Transportation Needs: No Transportation Needs (04/19/2024)   Received from [employer]    In the past 12 months, has lack of reliable transportation kept you from medical appointments, meetings, work or from getting things needed for daily living? : No  Physical Activity: Not on file  Stress: Not on file  Social Connections: Not on file  Intimate Partner Violence: Not on file  Depression (EYV7-0): Not on file  Alcohol Screen: Not on file  Housing: Low Risk (04/19/2024)   Received from [facility]   Epic    What is your living situation today?: I have a steady place to live    Think about the place you live. Do you have problems with any of the following? Choose all that apply:: None/None on this  list  Utilities: Low Risk (04/19/2024)   Received from [facility]   Utilities    In the past 12 months has the electric, gas, oil, or water company threatened to shut off services in your home? : No  Health Literacy: Not on file   Family History  Problem Relation Age of Onset   Graves' disease Mother    Hypertension Mother    Allergies[1]  Review of Systems  Constitutional:  Negative for chills and fever.  HENT:  Positive for ear pain. Negative for ear discharge and tinnitus.   Eyes: Negative.   Respiratory:  Negative for shortness of breath.   Cardiovascular:  Negative for chest pain.  Gastrointestinal: Negative.   Genitourinary: Negative.   Musculoskeletal: Negative.   Skin: Negative.   Neurological: Negative.   Endo/Heme/Allergies: Negative.   Psychiatric/Behavioral: Negative.        Objective:     Ht 5' 11 (1.803 m)   Wt (!) 366 lb (166 kg)   BMI 51.05 kg/m  BP Readings from Last 3 Encounters:  05/22/24 134/80  05/01/24 (!) 136/101  04/17/24 (!) 140/88   Wt Readings from Last 3 Encounters:  05/22/24 (!) 366 lb (166 kg)  05/01/24 (!) 353 lb (160.1 kg)  04/17/24 (!) 354 lb (160.6 kg)    Physical Exam Vitals and nursing note reviewed.     GENERAL: Alert, cooperative, well developed, no acute distress. HEENT: Normocephalic, normal oropharynx, moist mucous membranes. Impacted cerumen on right TM. No sore or infection in right ear. CHEST: Clear to auscultation bilaterally, no wheezes, rhonchi, or crackles. CARDIOVASCULAR: Normal heart rate and rhythm, S1 and S2 normal without murmurs. EXTREMITIES: No cyanosis or edema. NEUROLOGICAL: Cranial nerves grossly intact, moves all extremities without gross motor or sensory deficit.   Assessment & Plan:   Problem List Items Addressed This Visit   None  Assessment and Plan Essential hypertension Blood pressure remains elevated. Current hydralazine  regimen insufficient. - Increased hydralazine  to three times  daily.  Encouraged to continue checking blood pressure on a daily basis, red flags given for prompt reevaluation.  Patient to follow-up with mobile unit in 2 weeks.  Morbid obesity due to excess calories Ozempic  denied by Medicaid. Plan to try Wegovy  for coverage. - Attempted to obtain Wegovy .  Dry skin (xerosis cutis) Persistent dry skin despite lotion use. - Prescribed Cetaphil lotion.  Patient education given on supportive care  Dry eye syndrome, bilateral Bilateral dry eyes with redness, itching, and burning. - Recommended daily Zyrtec  for two weeks. - Prescribed eye drops.  Patient education given on supportive care  Impacted cerumen, right ear No infection noted. - Advised against Q-tips, recommended warm water in shower. -  Consider ENT referral if necessary.  Hypothyroidism Abnormal thyroid  levels. Adjustments made. - Recheck thyroid  levels in two weeks.  Gout with hyperuricemia Elevated uric acid levels. On allopurinol . - Rechecked uric acid levels today.  Vitamin D  deficiency Vitamin D  levels below normal. On weekly supplementation. - Continue vitamin D  once weekly for 12 weeks.  Seasonal allergic rhinitis - Recommended daily Zyrtec  for two weeks.   I have reviewed the patient's medical history (PMH, PSH, Social History, Family History, Medications, and allergies) , and have been updated if relevant. I spent 30 minutes reviewing chart and  face to face time with patient.   No follow-ups on file.    Jeniel Slauson S Mayers, PA-C     [1] No Known Allergies  "

## 2024-05-23 ENCOUNTER — Ambulatory Visit: Payer: Self-pay | Admitting: Physician Assistant

## 2024-05-23 DIAGNOSIS — Z8739 Personal history of other diseases of the musculoskeletal system and connective tissue: Secondary | ICD-10-CM

## 2024-05-23 LAB — URIC ACID: Uric Acid: 7.2 mg/dL (ref 3.8–8.4)

## 2024-05-23 MED ORDER — ALLOPURINOL 300 MG PO TABS: 300.0000 mg | ORAL_TABLET | Freq: Every day | ORAL | 1 refills | Status: AC

## 2024-05-24 NOTE — Progress Notes (Signed)
 Detailed message left with Stacie Hill aftercare coordinator at [facility] with Test results and provider recommendations.
